# Patient Record
Sex: Male | Born: 1965 | Race: White | Hispanic: No | Marital: Married | Smoking: Never smoker
Health system: Southern US, Community
[De-identification: ages and names within clinical notes are randomized; demographics above are authoritative.]

## PROBLEM LIST (undated history)

## (undated) ENCOUNTER — Ambulatory Visit (HOSPITAL_COMMUNITY): Admission: EM | Payer: 59

## (undated) DIAGNOSIS — E119 Type 2 diabetes mellitus without complications: Secondary | ICD-10-CM

## (undated) DIAGNOSIS — F32A Depression, unspecified: Secondary | ICD-10-CM

## (undated) DIAGNOSIS — T7840XA Allergy, unspecified, initial encounter: Secondary | ICD-10-CM

## (undated) DIAGNOSIS — I10 Essential (primary) hypertension: Secondary | ICD-10-CM

## (undated) DIAGNOSIS — I219 Acute myocardial infarction, unspecified: Secondary | ICD-10-CM

## (undated) DIAGNOSIS — C801 Malignant (primary) neoplasm, unspecified: Secondary | ICD-10-CM

## (undated) DIAGNOSIS — Z860100 Personal history of colon polyps, unspecified: Secondary | ICD-10-CM

## (undated) DIAGNOSIS — R011 Cardiac murmur, unspecified: Secondary | ICD-10-CM

## (undated) DIAGNOSIS — N189 Chronic kidney disease, unspecified: Secondary | ICD-10-CM

## (undated) DIAGNOSIS — J302 Other seasonal allergic rhinitis: Secondary | ICD-10-CM

## (undated) DIAGNOSIS — K219 Gastro-esophageal reflux disease without esophagitis: Secondary | ICD-10-CM

## (undated) DIAGNOSIS — K921 Melena: Secondary | ICD-10-CM

## (undated) DIAGNOSIS — M1990 Unspecified osteoarthritis, unspecified site: Secondary | ICD-10-CM

## (undated) DIAGNOSIS — F329 Major depressive disorder, single episode, unspecified: Secondary | ICD-10-CM

## (undated) DIAGNOSIS — G4730 Sleep apnea, unspecified: Secondary | ICD-10-CM

## (undated) HISTORY — DX: Allergy, unspecified, initial encounter: T78.40XA

## (undated) HISTORY — DX: Melena: K92.1

## (undated) HISTORY — DX: Chronic kidney disease, unspecified: N18.9

## (undated) HISTORY — DX: Type 2 diabetes mellitus without complications: E11.9

## (undated) HISTORY — DX: Depression, unspecified: F32.A

## (undated) HISTORY — DX: Other seasonal allergic rhinitis: J30.2

## (undated) HISTORY — DX: Cardiac murmur, unspecified: R01.1

## (undated) HISTORY — DX: Sleep apnea, unspecified: G47.30

## (undated) HISTORY — DX: Acute myocardial infarction, unspecified: I21.9

## (undated) HISTORY — PX: SPINE SURGERY: SHX786

## (undated) HISTORY — PX: FRACTURE SURGERY: SHX138

## (undated) HISTORY — DX: Essential (primary) hypertension: I10

## (undated) HISTORY — DX: Personal history of colon polyps, unspecified: Z86.0100

## (undated) HISTORY — DX: Personal history of colonic polyps: Z86.010

## (undated) HISTORY — PX: BACK SURGERY: SHX140

## (undated) HISTORY — PX: WRIST SURGERY: SHX841

## (undated) HISTORY — PX: ORIF ANKLE FRACTURE: SUR919

## (undated) HISTORY — PX: OTHER SURGICAL HISTORY: SHX169

---
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
## 1987-09-24 HISTORY — PX: LAMINECTOMY: SHX219

## 1987-09-24 HISTORY — PX: BACK SURGERY: SHX140

## 1988-09-23 HISTORY — PX: LAMINECTOMY: SHX219

## 2006-09-23 DIAGNOSIS — C801 Malignant (primary) neoplasm, unspecified: Secondary | ICD-10-CM

## 2006-09-23 HISTORY — PX: NEPHRECTOMY: SHX65

## 2006-09-23 HISTORY — DX: Malignant (primary) neoplasm, unspecified: C80.1

## 2010-09-23 HISTORY — PX: LEG AMPUTATION THROUGH LOWER TIBIA AND FIBULA: SHX697

## 2013-09-23 HISTORY — PX: NOSE SURGERY: SHX723

## 2016-09-23 HISTORY — PX: COLONOSCOPY: SHX174

## 2017-09-04 ENCOUNTER — Encounter: Payer: Self-pay | Admitting: Family Medicine

## 2017-11-19 ENCOUNTER — Encounter: Payer: Self-pay | Admitting: Family Medicine

## 2018-04-20 ENCOUNTER — Encounter: Payer: Self-pay | Admitting: Family Medicine

## 2018-12-11 ENCOUNTER — Encounter: Payer: Self-pay | Admitting: Family Medicine

## 2019-05-14 ENCOUNTER — Ambulatory Visit: Payer: 59 | Admitting: Family Medicine

## 2019-05-14 ENCOUNTER — Other Ambulatory Visit: Payer: Self-pay

## 2019-05-14 ENCOUNTER — Encounter: Payer: Self-pay | Admitting: Family Medicine

## 2019-05-14 VITALS — BP 130/84 | HR 78 | Temp 98.4°F | Resp 12 | Wt 338.4 lb

## 2019-05-14 DIAGNOSIS — E1122 Type 2 diabetes mellitus with diabetic chronic kidney disease: Secondary | ICD-10-CM

## 2019-05-14 DIAGNOSIS — E1169 Type 2 diabetes mellitus with other specified complication: Secondary | ICD-10-CM | POA: Insufficient documentation

## 2019-05-14 DIAGNOSIS — G4733 Obstructive sleep apnea (adult) (pediatric): Secondary | ICD-10-CM

## 2019-05-14 DIAGNOSIS — I10 Essential (primary) hypertension: Secondary | ICD-10-CM

## 2019-05-14 DIAGNOSIS — Z85528 Personal history of other malignant neoplasm of kidney: Secondary | ICD-10-CM | POA: Diagnosis not present

## 2019-05-14 DIAGNOSIS — E785 Hyperlipidemia, unspecified: Secondary | ICD-10-CM | POA: Diagnosis not present

## 2019-05-14 DIAGNOSIS — I2510 Atherosclerotic heart disease of native coronary artery without angina pectoris: Secondary | ICD-10-CM | POA: Diagnosis not present

## 2019-05-14 DIAGNOSIS — E1129 Type 2 diabetes mellitus with other diabetic kidney complication: Secondary | ICD-10-CM | POA: Insufficient documentation

## 2019-05-14 DIAGNOSIS — G63 Polyneuropathy in diseases classified elsewhere: Secondary | ICD-10-CM

## 2019-05-14 DIAGNOSIS — Z9989 Dependence on other enabling machines and devices: Secondary | ICD-10-CM

## 2019-05-14 DIAGNOSIS — G629 Polyneuropathy, unspecified: Secondary | ICD-10-CM | POA: Insufficient documentation

## 2019-05-14 LAB — COMPREHENSIVE METABOLIC PANEL
ALT: 24 U/L (ref 0–53)
AST: 13 U/L (ref 0–37)
Albumin: 4.8 g/dL (ref 3.5–5.2)
Alkaline Phosphatase: 68 U/L (ref 39–117)
BUN: 19 mg/dL (ref 6–23)
CO2: 28 mEq/L (ref 19–32)
Calcium: 9.7 mg/dL (ref 8.4–10.5)
Chloride: 103 mEq/L (ref 96–112)
Creatinine, Ser: 1.11 mg/dL (ref 0.40–1.50)
GFR: 68.68 mL/min (ref >60.00)
Glucose, Bld: 92 mg/dL (ref 70–99)
Potassium: 4.3 mEq/L (ref 3.5–5.1)
Sodium: 140 mEq/L (ref 135–145)
Total Bilirubin: 0.9 mg/dL (ref 0.2–1.2)
Total Protein: 6.8 g/dL (ref 6.0–8.3)

## 2019-05-14 LAB — LIPID PANEL
Cholesterol: 118 mg/dL (ref 0–200)
HDL: 41 mg/dL (ref >39.00)
LDL Cholesterol: 48 mg/dL (ref 0–99)
NonHDL: 77.42
Total CHOL/HDL Ratio: 3
Triglycerides: 148 mg/dL (ref 0.0–149.0)
VLDL: 29.6 mg/dL (ref 0.0–40.0)

## 2019-05-14 MED ORDER — LISINOPRIL 20 MG PO TABS: 20.0000 mg | ORAL_TABLET | Freq: Every day | ORAL | 1 refills | Status: DC

## 2019-05-14 MED ORDER — AMLODIPINE BESYLATE 10 MG PO TABS: 10.0000 mg | ORAL_TABLET | Freq: Every day | ORAL | 2 refills | Status: DC

## 2019-05-14 NOTE — Patient Instructions (Signed)
A few things to remember from today's visit:   Hx of renal cell cancer - Plan: Ambulatory referral to Urology  Type 2 diabetes mellitus with chronic kidney disease, without long-term current use of insulin, unspecified CKD stage ([place]) - Plan: Comprehensive metabolic panel  Benign essential hypertension - Plan: Comprehensive metabolic panel, lisinopril (ZESTRIL) 20 MG tablet, amLODipine (NORVASC) 10 MG tablet  Hyperlipidemia associated with type 2 diabetes mellitus ([place]) - Plan: Comprehensive metabolic panel, Lipid panel  Coronary artery disease involving native heart without angina pectoris, unspecified vessel or lesion type - Plan: Ambulatory referral to Cardiology  OSA treated with BiPAP - Plan: Ambulatory referral to Pulmonology  No changes on any other medications today.  Please be sure medication list is accurate. If a new problem present, please set up appointment sooner than planned today.

## 2019-05-14 NOTE — Progress Notes (Signed)
HPI:   Wayne Larsen is a 53 y.o. male, who is here today to establish care.  Former PCP: He moved from [place] in 06/2018, has been back and fourth until recently. Last preventive routine visit: 2 years ago. He lives alone for now. His wife is still in [place], will stay there until she retires.  Chronic medical problems:   DM II with peripheral neuropathy.  Dx in 2018. He follows with endocrinologist. He is on 850 bid,Farxiga 10 mg daily,and Rybelsus 7 mg daily. He is on Lyrica 200 mg bid, which helps with pain and numbness sensation.  HTN: He is on Amlodipine 10 mg and Lisinopril 20 mg daily. He doe snot check BP regularly. Negative for headache,visual changes,orthopnea,or edema.  Reporting Hx of CAD. He could not complete stress test x 2, chemical stress test was "horrible." He has not noted chest pain,dyspnea,palpitations. HLD on Atorvastatin 40 mg. Last checked over 6 months ago.  OSA and insomnia: He wears CPAP every night. He is also on Ambien 10 mg daily as needed. Until 11/2018 he was traveling almost weekly,local and overseas.  Renal cancer s/p right nephrectomy, 2008. Has followed with urologist annually.   Concerns today: He needs referrals.  Review of Systems  Constitutional: Negative for activity change, appetite change, chills and fever.  HENT: Negative for mouth sores, nosebleeds and sore throat.   Respiratory: Negative for cough and wheezing.   Gastrointestinal: Negative for blood in stool.       No changes in bowel habits.  Endocrine: Negative for polydipsia, polyphagia and polyuria.  Genitourinary: Negative for decreased urine volume, dysuria and hematuria.  Musculoskeletal: Negative for gait problem.  Skin: Negative for rash and wound.  Neurological: Negative for syncope and facial asymmetry.  Rest see pertinent positives and negatives per HPI.   Current Outpatient Medications on File Prior to Visit  Medication Sig Dispense Refill   . ALREX 0.2 % SUSP PLEASE SEE ATTACHED FOR DETAILED DIRECTIONS    . atorvastatin (LIPITOR) 40 MG tablet TAKE 1 TAB BY MOUTH AT BEDTIME    . FARXIGA 10 MG TABS tablet Take 10 mg by mouth daily.    . metFORMIN (GLUCOPHAGE) 850 MG tablet Take 850 mg by mouth 2 (two) times daily.    Marland Kitchen PAZEO 0.7 % SOLN INSTILL 1 DROP INTO BOTH EYES DAILY DURING ALLERGY SEASON    . pregabalin (LYRICA) 200 MG capsule Take 200 mg by mouth 2 (two) times daily.    . RYBELSUS 7 MG TABS TAKE 1 TAB WITH 4OZ OF WATER ON EMPTY STOMACH 30MIN BEFORE BREAKFAST OR OTHER MEDS DAILY    . trimethoprim-polymyxin b (POLYTRIM) ophthalmic solution INSTILL 1 DROP INTO AFFECTED EYE 4 TIMES A DAY FOR 5 DAYS    . zolpidem (AMBIEN) 10 MG tablet Take 10 mg by mouth at bedtime.     No current facility-administered medications on file prior to visit.      Past Medical History:  Diagnosis Date  . Allergy    seasonal allergies  . Blood in stool    history of blood in stool  . Cancer [facility]) 2008   kidney  . Chronic kidney disease   . Depression   . Diabetes mellitus without complication ([place])   . Heart murmur   . History of colon polyps   . Hypertension    No Known Allergies  Family History  Problem Relation Age of Onset  . Cancer Mother   . Early death Mother   .  Arthritis Father   . Alcohol abuse Father   . Heart disease Father   . Hyperlipidemia Father   . Hypertension Father     Social History   Socioeconomic History  . Marital status: Married    Spouse name: Not on file  . Number of children: 2  . Years of education: Not on file  . Highest education level: Not on file  Occupational History  . Not on file  Social Needs  . Financial resource strain: Not on file  . Food insecurity    Worry: Not on file    Inability: Not on file  . Transportation needs    Medical: Not on file    Non-medical: Not on file  Tobacco Use  . Smoking status: Never Smoker  . Smokeless tobacco: Never Used  Substance and Sexual  Activity  . Alcohol use: Yes  . Drug use: Never  . Sexual activity: Yes  Lifestyle  . Physical activity    Days per week: Not on file    Minutes per session: Not on file  . Stress: Not on file  Relationships  . Social Herbalist on phone: Not on file    Gets together: Not on file    Attends religious service: Not on file    Active member of club or organization: Not on file    Attends meetings of clubs or organizations: Not on file    Relationship status: Not on file  Other Topics Concern  . Not on file  Social History Narrative  . Not on file    Vitals:   05/14/19 1226  BP: 130/84  Pulse: 78  Resp: 12  Temp: 98.4 F (36.9 C)  SpO2: 96%    There is no height or weight on file to calculate BMI.  Physical Exam  Nursing note and vitals reviewed. Constitutional: He is oriented to person, place, and time. He appears well-developed. No distress.  HENT:  Head: Normocephalic and atraumatic.  Mouth/Throat: Oropharynx is clear and moist and mucous membranes are normal.  Eyes: Pupils are equal, round, and reactive to light. Conjunctivae are normal.  Cardiovascular: Normal rate and regular rhythm.  No murmur heard. Pulses:      Dorsalis pedis pulses are 2+ on the right side and 2+ on the left side.  Respiratory: Effort normal and breath sounds normal. No respiratory distress.  GI: Soft. He exhibits no mass. There is no hepatomegaly. There is no abdominal tenderness.  Musculoskeletal:        General: No edema.  Lymphadenopathy:    He has no cervical adenopathy.  Neurological: He is alert and oriented to person, place, and time. He has normal strength. No cranial nerve deficit. Gait normal.  Skin: Skin is warm. No rash noted. No erythema.  Psychiatric: He has a normal mood and affect.  Well groomed, good eye contact.    ASSESSMENT AND PLAN:  Mr. Yahye was seen today for establish care.  Diagnoses and all orders for this visit:  Lab Results  Component Value  Date   CHOL 118 05/14/2019   HDL 41.00 05/14/2019   LDLCALC 48 05/14/2019   TRIG 148.0 05/14/2019   CHOLHDL 3 05/14/2019   Lab Results  Component Value Date   CREATININE 1.11 05/14/2019   BUN 19 05/14/2019   NA 140 05/14/2019   K 4.3 05/14/2019   CL 103 05/14/2019   CO2 28 05/14/2019   Lab Results  Component Value Date   ALT  24 05/14/2019   AST 13 05/14/2019   ALKPHOS 68 05/14/2019   BILITOT 0.9 05/14/2019    Benign essential hypertension Adequately controlled. No changes in current management. Low salt diet recommended. Monitor BP regularly. Eye exam at least annually. F/U in 6 months, before if needed.  -     Comprehensive metabolic panel -     lisinopril (ZESTRIL) 20 MG tablet; Take 1 tablet (20 mg total) by mouth daily. -     amLODipine (NORVASC) 10 MG tablet; Take 1 tablet (10 mg total) by mouth daily.  Type 2 diabetes mellitus with chronic kidney disease, without long-term current use of insulin, unspecified CKD stage ([place]) Tolerating meds well. Continue following with [facility]ocrinologist at [facility].  -     Comprehensive metabolic panel  Polyneuropathy associated with underlying disease ([place]) Lyrica 200 mg bid is helping. Stressed the importance of appropriate foot care. Following with endocrinologist.  Hyperlipidemia associated with type 2 diabetes mellitus ([place]) -     Comprehensive metabolic panel -     Lipid panel  Coronary artery disease involving native heart without angina pectoris, unspecified vessel or lesion type Asymptomatic but reporting not being able to complete stress test in the past due to symptomatology, ? Angina. Instructed about warning signs. He is not on Aspirin. Continue statin.  -     Ambulatory referral to Cardiology  Hx of renal cell cancer Continue following annually with urologists.  -     Ambulatory referral to Urology  OSA on CPAP Continue following with pulmonologist/sleep specialist. Encouraged to continue being  compliant with CPAP use.  -     Ambulatory referral to Pulmonology   Return in about 3 months (around 08/14/2019) for cpe.      G. Martinique, MD  [facility]. [place] office.

## 2019-05-15 MED ORDER — ATORVASTATIN CALCIUM 40 MG PO TABS: ORAL_TABLET | ORAL | 3 refills | Status: DC

## 2019-06-17 ENCOUNTER — Encounter: Payer: Self-pay | Admitting: Family Medicine

## 2019-06-24 ENCOUNTER — Institutional Professional Consult (permissible substitution): Payer: 59 | Admitting: Pulmonary Disease

## 2019-07-07 ENCOUNTER — Telehealth: Payer: Self-pay | Admitting: Family Medicine

## 2019-07-07 NOTE — Telephone Encounter (Signed)
Recv'd medical records from [facility] forwarded 60 pages to Dr. Ivory Broad 10/14/20fbg

## 2019-07-14 ENCOUNTER — Telehealth: Payer: Self-pay | Admitting: Family Medicine

## 2019-07-14 NOTE — Telephone Encounter (Signed)
Medication Refill - Medication: RYBELSUS 14 MG TABS   Has the patient contacted their pharmacy? No. (Agent: If no, request that the patient contact the pharmacy for the refill.) (Agent: If yes, when and what did the pharmacy advise?)  Preferred Pharmacy (with phone number or street name):  [id number] - [place], [address] [phone] (Phone) [phone] (Fax)     Agent: Please be advised that RX refills may take up to 3 business days. We ask that you follow-up with your pharmacy.

## 2019-07-14 NOTE — Telephone Encounter (Signed)
Recv'd medical records from [facility] forwarded 11 pages to Dr. Betty Martinique 10/21/20fbg

## 2019-07-19 ENCOUNTER — Other Ambulatory Visit: Payer: Self-pay

## 2019-07-19 NOTE — Telephone Encounter (Signed)
Please explained she is not on Rybelsus, this is a medication for diabetes that I have not prescribed.  Thanks, BJ

## 2019-07-19 NOTE — Telephone Encounter (Signed)
Ok for refill? 

## 2019-07-21 NOTE — Telephone Encounter (Signed)
Pt needs refill, please advise

## 2019-08-02 ENCOUNTER — Other Ambulatory Visit: Payer: Self-pay

## 2019-08-02 ENCOUNTER — Encounter: Payer: Self-pay | Admitting: Pulmonary Disease

## 2019-08-02 ENCOUNTER — Encounter: Payer: Self-pay | Admitting: Cardiovascular Disease

## 2019-08-02 ENCOUNTER — Ambulatory Visit: Payer: 59 | Admitting: Cardiovascular Disease

## 2019-08-02 VITALS — BP 140/84 | HR 71 | Ht 75.0 in | Wt 342.0 lb

## 2019-08-02 DIAGNOSIS — I10 Essential (primary) hypertension: Secondary | ICD-10-CM | POA: Diagnosis not present

## 2019-08-02 DIAGNOSIS — E785 Hyperlipidemia, unspecified: Secondary | ICD-10-CM | POA: Diagnosis not present

## 2019-08-02 DIAGNOSIS — I2510 Atherosclerotic heart disease of native coronary artery without angina pectoris: Secondary | ICD-10-CM

## 2019-08-02 DIAGNOSIS — E1169 Type 2 diabetes mellitus with other specified complication: Secondary | ICD-10-CM

## 2019-08-02 MED ORDER — POTASSIUM CHLORIDE ER 10 MEQ PO TBCR: 10.0000 meq | EXTENDED_RELEASE_TABLET | Freq: Every day | ORAL | 11 refills | Status: DC

## 2019-08-02 MED ORDER — ATORVASTATIN CALCIUM 40 MG PO TABS: ORAL_TABLET | ORAL | 3 refills | Status: DC

## 2019-08-02 MED ORDER — AMLODIPINE BESYLATE 5 MG PO TABS: 5.0000 mg | ORAL_TABLET | Freq: Every day | ORAL | 3 refills | Status: DC

## 2019-08-02 MED ORDER — HYDROCHLOROTHIAZIDE 25 MG PO TABS: 25.0000 mg | ORAL_TABLET | Freq: Every day | ORAL | 11 refills | Status: DC

## 2019-08-02 NOTE — Patient Instructions (Addendum)
Medication Instructions:  Your physician has recommended you make the following change in your medication:  START HCTZ (Hydrochlorothiazide) 25 mg once daily START Kdur (Potassium chloride) 10 mEq once daily DECREASE Amlodipine to 5 mg once daily  *If you need a refill on your cardiac medications before your next appointment, please call your pharmacy*   Lab Work: None Ordered   Testing/Procedures: None Ordered   Follow-Up: Your physician recommends that you send [place] follow-up blood pressure readings after your appointment with Dr. Martinique on Dec. 2   At [facility], you and your health needs are our priority.  As part of our continuing mission to provide you with exceptional heart care, we have created designated Provider Care Teams.  These Care Teams include your primary Cardiologist (physician) and Advanced Practice Providers (APPs -  Physician Assistants and Nurse Practitioners) who all work together to provide you with the care you need, when you need it.  Your next appointment:   3 months on February 10 at 4:20 pm with Dr. Acie Fredrickson  The format for your next appointment:   In Person  Provider:   You may see Dr. Acie Fredrickson or one of the following Advanced Practice Providers on your designated Care Team:    Richardson Dopp, PA-C  [place], [place]  Daune Perch, [place]

## 2019-08-02 NOTE — Progress Notes (Signed)
Cardiology Office Note:    Date:  08/02/2019   ID:  Wayne Larsen, DOB 1965-10-10, MRN [id number]  PCP:  Martinique, Betty G, MD  Cardiologist:  No primary care provider on file.  Electrophysiologist:  None   Referring MD: Martinique, Betty G, MD   Chief Complaint  Patient presents with  . Hypertension  . Hyperlipidemia  . Diabetes Mellitus     History of Present Illness:    Wayne Larsen is a 53 y.o. male with a hx of hypertension, hyperlipidemia.  He recently moved from [place] and is here to establish care.  We were asked to see him for his HTN and hyperlipidemia by Dr. [name] .   Hx of renal cell cancer - s/p right nephrectomy    Originally from [place] .  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  Has been told that had a MI years ago.  Has not had cath or stenting .   Fairly active now.   Luz [place] quite a bit .  [place] .   Trying to start an exercise program .  Works 16 hour days .   No CP , works in yard without any issues.  Eats quite a bit out on the road.  And when he is at home he cooks foods that are typically preprepared.     Past Medical History:  Diagnosis Date  . Allergy    seasonal allergies  . Blood in stool    history of blood in stool  . Cancer [facility]) 2008   kidney  . Chronic kidney disease   . Depression   . Diabetes mellitus without complication ([place])   . Heart murmur   . History of colon polyps   . Hypertension     Past Surgical History:  Procedure Laterality Date  . [place]  . 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 NOSE SURGERY  2015    Current Medications: Current Meds  Medication Sig  . ALREX 0.2 % SUSP PLEASE SEE ATTACHED FOR DETAILED DIRECTIONS  . FARXIGA 10 MG TABS tablet Take 10 mg by mouth  daily.  Marland Kitchen lisinopril (ZESTRIL) 20 MG tablet Take 1 tablet (20 mg total) by mouth daily.  . metFORMIN (GLUCOPHAGE) 850 MG tablet Take 850 mg by mouth 2 (two) times daily.  Marland Kitchen PAZEO 0.7 % SOLN INSTILL 1 DROP INTO BOTH EYES DAILY DURING ALLERGY SEASON  . pregabalin (LYRICA) 200 MG capsule Take 200 mg by mouth 2 (two) times daily.  . Semaglutide (RYBELSUS) 14 MG TABS Take 1 tablet by mouth daily.  Marland Kitchen trimethoprim-polymyxin b (POLYTRIM) ophthalmic solution INSTILL 1 DROP INTO AFFECTED EYE 4 TIMES A DAY FOR 5 DAYS  . zolpidem (AMBIEN) 10 MG tablet Take 10 mg by mouth at bedtime.  . [DISCONTINUED] amLODipine (NORVASC) 10 MG tablet Take 1 tablet (10 mg total) by mouth daily.  . [DISCONTINUED] atorvastatin (LIPITOR) 40 MG tablet TAKE 1 TAB BY MOUTH AT BEDTIME     Allergies:   Patient has no known allergies.   Social History   Socioeconomic History  . Marital status: Married    Spouse name: Not on file  . Number of children: 2  . Years of education: Not on file  .  Highest education level: Not on file  Occupational History  . Not on file  Social Needs  . Financial resource strain: Not on file  . Food insecurity    Worry: Not on file    Inability: Not on file  . Transportation needs    Medical: Not on file    Non-medical: Not on file  Tobacco Use  . Smoking status: Never Smoker  . Smokeless tobacco: Never Used  Substance and Sexual Activity  . Alcohol use: Yes  . Drug use: Never  . Sexual activity: Yes  Lifestyle  . Physical activity    Days per week: Not on file    Minutes per session: Not on file  . Stress: Not on file  Relationships  . Social Herbalist on phone: Not on file    Gets together: Not on file    Attends religious service: Not on file    Active member of club or organization: Not on file    Attends meetings of clubs or organizations: Not on file    Relationship status: Not on file  Other Topics Concern  . Not on file  Social History Narrative  . Not on  file     Family History: The patient's family history includes Alcohol abuse in his father; Arthritis in his father; Cancer in his mother; Early death in his mother; Heart disease in his father; Hyperlipidemia in his father; Hypertension in his father.  ROS:   Please see the history of present illness.     All other systems reviewed and are negative.  EKGs/Labs/Other Studies Reviewed:    The following studies were reviewed today:   EKG:  Nov. 9, 2020 :  NSR at 7,   Inf. MI ,    Recent Labs: 05/14/2019: ALT 24; BUN 19; Creatinine, Ser 1.11; Potassium 4.3; Sodium 140  Recent Lipid Panel    Component Value Date/Time   CHOL 118 05/14/2019 1312   TRIG 148.0 05/14/2019 1312   HDL 41.00 05/14/2019 1312   CHOLHDL 3 05/14/2019 1312   VLDL 29.6 05/14/2019 1312   LDLCALC 48 05/14/2019 1312    Physical Exam:    VS:  BP 140/84   Pulse 71   Ht 6\' 3"  (1.905 m)   Wt (!) 342 lb (155.1 kg)   SpO2 97%   BMI 42.75 kg/m     Wt Readings from Last 3 Encounters:  08/02/19 (!) 342 lb (155.1 kg)  05/14/19 (!) 338 lb 6 oz (153.5 kg)     GEN: middle age male,  Morbid obesty .. no acute  distress HEENT: Normal NECK: No JVD; No carotid bruits LYMPHATICS: No lymphadenopathy CARDIAC: RRR, no murmurs, rubs, gallops RESPIRATORY:  Clear to auscultation without rales, wheezing or rhonchi  ABDOMEN: Soft, non-tender, non-distended MUSCULOSKELETAL:  No edema; No deformity  SKIN: Warm and dry NEUROLOGIC:  Alert and oriented x 3 PSYCHIATRIC:  Normal affect   ASSESSMENT:    1. Essential hypertension   2. Hyperlipidemia, unspecified hyperlipidemia type    PLAN:    In order of problems listed above:  1. Essential hypertension: [name] presents today for further evaluation of hypertension hyperlipidemia.  He has mildly elevated blood pressure today.  He complains of some leg swelling.  He is on amlodipine 10 mg a day.  We will reduce the amlodipine to 5 mg a day.  We will add HCTZ 25 mg a day and  K-Dur 10  milliequivalents a day.  We stressed the  need for better diet, exercise, weight loss program.  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  He really does not have much time off.  He knows that he needs to hire more people in order to get healthy but at present that is a difficult thing to do.  2.  Hyperlipidemia: Continue atorvastatin.  Will refill this. Check labs in 3 months     Medication Adjustments/Labs and Tests Ordered: Current medicines are reviewed at length with the patient today.  Concerns regarding medicines are outlined above.  Orders Placed This Encounter  Procedures  . Basic Metabolic Panel (BMET)  . Lipid Profile  . Basic Metabolic Panel (BMET)  . Hepatic function panel  . EKG 12-Lead   Meds ordered this encounter  Medications  . amLODipine (NORVASC) 5 MG tablet    Sig: Take 1 tablet (5 mg total) by mouth daily.    Dispense:  90 tablet    Refill:  3  . potassium chloride (KLOR-CON) 10 MEQ tablet    Sig: Take 1 tablet (10 mEq total) by mouth daily.    Dispense:  30 tablet    Refill:  11  . hydrochlorothiazide (HYDRODIURIL) 25 MG tablet    Sig: Take 1 tablet (25 mg total) by mouth daily.    Dispense:  30 tablet    Refill:  11    Patient Instructions  Medication Instructions:  Your physician has recommended you make the following change in your medication:  START HCTZ (Hydrochlorothiazide) 25 mg once daily START Kdur (Potassium chloride) 10 mEq once daily DECREASE Amlodipine to 5 mg once daily  *If you need a refill on your cardiac medications before your next appointment, please call your pharmacy*   Lab Work: None Ordered   Testing/Procedures: None Ordered   Follow-Up: Your physician recommends that you send [place] follow-up blood pressure readings after your appointment with Dr. Martinique on Dec. 2   At [facility], you and your health needs are our priority.  As part of our  continuing mission to provide you with exceptional heart care, we have created designated Provider Care Teams.  These Care Teams include your primary Cardiologist (physician) and Advanced Practice Providers (APPs -  Physician Assistants and Nurse Practitioners) who all work together to provide you with the care you need, when you need it.  Your next appointment:   3 months on February 10 at 4:20 pm with Dr. Acie Fredrickson  The format for your next appointment:   In Person  Provider:   You may see Dr. Acie Fredrickson or one of the following Advanced Practice Providers on your designated Care Team:    Richardson Dopp, PA-C  [place], [place]  Daune Perch, [place]       Signed, Mertie Moores, MD  08/02/2019 6:13 PM    [place]

## 2019-08-03 ENCOUNTER — Encounter: Payer: Self-pay | Admitting: Pulmonary Disease

## 2019-08-03 ENCOUNTER — Ambulatory Visit (INDEPENDENT_AMBULATORY_CARE_PROVIDER_SITE_OTHER): Payer: 59 | Admitting: Pulmonary Disease

## 2019-08-03 DIAGNOSIS — G4733 Obstructive sleep apnea (adult) (pediatric): Secondary | ICD-10-CM | POA: Diagnosis not present

## 2019-08-03 DIAGNOSIS — F5104 Psychophysiologic insomnia: Secondary | ICD-10-CM | POA: Diagnosis not present

## 2019-08-03 DIAGNOSIS — G4700 Insomnia, unspecified: Secondary | ICD-10-CM | POA: Insufficient documentation

## 2019-08-03 MED ORDER — ZOLPIDEM TARTRATE 10 MG PO TABS: 10.0000 mg | ORAL_TABLET | Freq: Every evening | ORAL | 2 refills | Status: DC | PRN

## 2019-08-03 NOTE — Telephone Encounter (Signed)
Called LVM  [facility] REGARDING PT medical record, advised to call office for further information    Copied from [place] #[id number]. Topic: General - Other >> Aug 02, 2019 10:06 AM Yvette Rack wrote: Reason for CRM: Guinevere Ferrari with [facility] stated she spoke with Medical records dept and was told pt records are not available. Rose would like to know if Dr. Martinique has pt medical records. Rose stated it about 24 pages that has not been uploaded. Cb# [phone]

## 2019-08-03 NOTE — Patient Instructions (Signed)
Please call us back with the name of DME company. You are on auto BiPAP settings We will send them in order to discontinue ramp time.  Refill on Ambien will be sent

## 2019-08-03 NOTE — Progress Notes (Signed)
Subjective:    Patient ID: Wayne Larsen, male    DOB: Jun 24, 1966, 53 y.o.   MRN: [id number]  HPI  53 year old presents to establish care for obstructive sleep apnea. He works as the Market researcher and is originally from [place]. He has just moved from [place] to [place].  He was diagnosed with severe OSA and insomnia and has been on PAP since 2008.  He underwent repeat testing in [place] and was placed on auto BiPAP around 2016.  He reports improvement in his daytime somnolence and fatigue and has had good results, has settled with a full facemask. He wishes that pressure could be higher he does not like the ramp time of 15 minutes at 4 cm and the low pressure.  Epworth sleepiness score is 11. Bedtime is around 10:30 PM, he takes Ambien about once a week and when he takes his falls asleep fairly quickly.  He sleeps on his side with 1 pillow, reports 2-3 nocturnal awakenings and is out of bed by 6 AM feeling rested without dryness of mouth or headaches There is no history suggestive of cataplexy, sleep paralysis or parasomnias   He reports a high pressure job involving working with different time zones and therefore his sleep habits are somewhat skewed.  Hypertension is well controlled on 3 medications, diabetes is currently controlled.  He has been unable to lose weight in the past few years  BiPAP download was reviewed which shows average pressure of 16/12 cm, ramp time: 15 minutes, maximum Pressure of 20/16 centimeters with good compliance and minimal leak, residual AHI 5.8/hour  Past Medical History:  Diagnosis Date  . Allergy    seasonal allergies  . Blood in stool    history of blood in stool  . Cancer [facility]) 2008   kidney  . Chronic kidney disease   . Depression   . Diabetes mellitus without complication ([place])   . Heart murmur   . History of colon polyps   . Hypertension    Past Surgical History:  Procedure Laterality Date  . [place]  . 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 NOSE SURGERY  2015    No Known Allergies  Social History   Socioeconomic History  . Marital status: Married    Spouse name: Not on file  . Number of children: 2  . Years of education: Not on file  . Highest education level: Not on file  Occupational History  . Not on file  Social Needs  . Financial resource strain: Not on file  . Food insecurity    Worry: Not on file    Inability: Not on file  . Transportation needs    Medical: Not on file    Non-medical: Not on file  Tobacco Use  . Smoking status: Never Smoker  . Smokeless tobacco: Never Used  Substance and Sexual Activity  . Alcohol use: Yes  . Drug use: Never  . Sexual activity: Yes  Lifestyle  . Physical activity    Days per week: Not on file    Minutes per session: Not on file  . Stress: Not on file  Relationships  . Social Herbalist on phone: Not on file    Gets together: Not on file    Attends religious service: Not on file    Active member of club or organization: Not on file    Attends meetings of  clubs or organizations: Not on file    Relationship status: Not on file  . Intimate partner violence    Fear of current or ex partner: Not on file    Emotionally abused: Not on file    Physically abused: Not on file    Forced sexual activity: Not on file  Other Topics Concern  . Not on file  Social History Narrative  . Not on file    Family History  Problem Relation Age of Onset  . Cancer Mother   . Early death Mother   . Arthritis Father   . Alcohol abuse Father   . Heart disease Father   . Hyperlipidemia Father   . Hypertension Father     Review of Systems Constitutional: negative for anorexia, fevers and sweats  Eyes: negative for irritation, redness and visual disturbance  Ears, nose, mouth, throat, and face: negative for earaches, epistaxis, nasal congestion and sore throat  Respiratory:  negative for cough, dyspnea on exertion, sputum and wheezing  Cardiovascular: negative for chest pain, dyspnea, lower extremity edema, orthopnea, palpitations and syncope  Gastrointestinal: negative for abdominal pain, constipation, diarrhea, melena, nausea and vomiting  Genitourinary:negative for dysuria, frequency and hematuria  Hematologic/lymphatic: negative for bleeding, easy bruising and lymphadenopathy  Musculoskeletal:negative for arthralgias, muscle weakness and stiff joints  Neurological: negative for coordination problems, gait problems, headaches and weakness  Endocrine: negative for diabetic symptoms including polydipsia, polyuria and weight loss     Objective:   Physical Exam  Gen. Pleasant, obese, in no distress, normal affect ENT - no pallor,icterus, no post nasal drip, class 2-3 airway Neck: No JVD, no thyromegaly, no carotid bruits Lungs: no use of accessory muscles, no dullness to percussion, decreased without rales or rhonchi  Cardiovascular: Rhythm regular, heart sounds  normal, no murmurs or gallops, no peripheral edema Abdomen: soft and non-tender, no hepatosplenomegaly, BS normal. Musculoskeletal: No deformities, no cyanosis or clubbing Neuro:  alert, non focal, no tremors       Assessment & Plan:

## 2019-08-03 NOTE — Assessment & Plan Note (Signed)
Refill on Ambien will be sent Discussed lifestyle changes and possible side effects of excessive sedation

## 2019-08-03 NOTE — Assessment & Plan Note (Addendum)
Please call us back with the name of DME company. You are on auto BiPAP settings We will send them in order to discontinue ramp time.  We will also try to obtain his sleep studies from his physician in [place] He is very compliant with BiPAP is helping to improve his daytime somnolence and fatigue  Weight loss encouraged, compliance with goal of at least 4-6 hrs every night is the expectation. Advised against medications with sedative side effects Cautioned against driving when sleepy - understanding that sleepiness will vary on a day to day basis

## 2019-08-24 ENCOUNTER — Other Ambulatory Visit: Payer: Self-pay

## 2019-08-25 ENCOUNTER — Ambulatory Visit (INDEPENDENT_AMBULATORY_CARE_PROVIDER_SITE_OTHER): Payer: 59 | Admitting: Family Medicine

## 2019-08-25 ENCOUNTER — Encounter: Payer: Self-pay | Admitting: Family Medicine

## 2019-08-25 VITALS — BP 120/80 | HR 75 | Temp 98.2°F | Ht 75.0 in | Wt 338.8 lb

## 2019-08-25 DIAGNOSIS — E1122 Type 2 diabetes mellitus with diabetic chronic kidney disease: Secondary | ICD-10-CM | POA: Diagnosis not present

## 2019-08-25 DIAGNOSIS — Z0000 Encounter for general adult medical examination without abnormal findings: Secondary | ICD-10-CM | POA: Diagnosis not present

## 2019-08-25 DIAGNOSIS — I10 Essential (primary) hypertension: Secondary | ICD-10-CM | POA: Diagnosis not present

## 2019-08-25 DIAGNOSIS — Z23 Encounter for immunization: Secondary | ICD-10-CM | POA: Diagnosis not present

## 2019-08-25 LAB — BASIC METABOLIC PANEL
BUN: 23 mg/dL (ref 6–23)
CO2: 31 mEq/L (ref 19–32)
Calcium: 9.6 mg/dL (ref 8.4–10.5)
Chloride: 99 mEq/L (ref 96–112)
Creatinine, Ser: 1.27 mg/dL (ref 0.40–1.50)
GFR: 58.74 mL/min — ABNORMAL LOW (ref >60.00)
Glucose, Bld: 120 mg/dL — ABNORMAL HIGH (ref 70–99)
Potassium: 4.6 mEq/L (ref 3.5–5.1)
Sodium: 138 mEq/L (ref 135–145)

## 2019-08-25 NOTE — Progress Notes (Signed)
HPI:  [name] is a 53 y.o.male here today for his routine physical examination.  Last CPE: 07/03/2018. He lives alone for now, his wife with move here after she retires in 2021.  Regular exercise 3 or more times per week: No, he is planning on starting walking. Following a healthy diet: Not consistently, high sodium diet,planning on try fresh prepare meals service.  Chronic medical problems: DM2 (A1c on 06/03/2019 was 6.4), hyperlipidemia, hypertension, obesity, insomnia, OSA on BiPAP, depression, peripheral neuropathy, and CAD among some.  There is no immunization history on file for this patient.  -Hep C screening: 04/2017 NR  Last colon cancer screening: Colonoscopy in 08/2017, polypectomy x14.  He is due in 08/2022. Last prostate ca screening: 07/09/19, 0.32. He follows with urologist annually.  -Denies high alcohol intake, tobacco use, or Hx of illicit drug use.  -Concerns and/or follow up today: Renal function check.  His cardiologist adjusted his antihypertensive medications, added HCTZ 25 mg and decrease amlodipine to 5 mg. When he first started medication he developed nausea, "not feeling myself", it is progressively getting better. He is also on potassium supplementation 10 mEq daily.  DM2: He follows with endocrinologist.  Metformin dose was decreased from 850 mg twice daily to 850 mg daily.  Rest of the medication unchanged. BS's are "good."  Review of Systems  Constitutional: Negative for activity change, appetite change, fatigue and fever.  HENT: Negative for dental problem, nosebleeds, sore throat and trouble swallowing.   Eyes: Negative for redness and visual disturbance.  Respiratory: Negative for cough, shortness of breath and wheezing.   Cardiovascular: Negative for chest pain, palpitations and leg swelling.  Gastrointestinal: Negative for abdominal pain, blood in stool, and vomiting.  Endocrine: Negative for cold intolerance, heat intolerance,  polydipsia, polyphagia and polyuria.  Genitourinary: Negative for decreased urine volume, discharge, dysuria, genital sores, hematuria and testicular pain.  Musculoskeletal: Negative for gait problem and myalgias.  Skin: Negative for color change and rash.  Allergic/Immunologic: Positive for environmental allergies.  Neurological: Negative for syncope, weakness and headaches.  Hematological: Negative for adenopathy. Does not bruise/bleed easily.  Psychiatric/Behavioral: Negative for confusion. The patient is not nervous/anxious.   All other systems reviewed and are negative.  Current Outpatient Medications on File Prior to Visit  Medication Sig Dispense Refill  . ALREX 0.2 % SUSP PLEASE SEE ATTACHED FOR DETAILED DIRECTIONS    . amLODipine (NORVASC) 5 MG tablet Take 1 tablet (5 mg total) by mouth daily. 90 tablet 3  . atorvastatin (LIPITOR) 40 MG tablet TAKE 1 TAB BY MOUTH AT BEDTIME 90 tablet 3  . FARXIGA 10 MG TABS tablet Take 10 mg by mouth daily.    . hydrochlorothiazide (HYDRODIURIL) 25 MG tablet Take 1 tablet (25 mg total) by mouth daily. 30 tablet 11  . lisinopril (ZESTRIL) 20 MG tablet Take 1 tablet (20 mg total) by mouth daily. 90 tablet 1  . metFORMIN (GLUCOPHAGE) 850 MG tablet Take 850 mg by mouth daily.    Marland Kitchen PAZEO 0.7 % SOLN INSTILL 1 DROP INTO BOTH EYES DAILY DURING ALLERGY SEASON    . potassium chloride (KLOR-CON) 10 MEQ tablet Take 1 tablet (10 mEq total) by mouth daily. 30 tablet 11  . pregabalin (LYRICA) 200 MG capsule Take 200 mg by mouth 2 (two) times daily.    . Semaglutide (RYBELSUS) 14 MG TABS Take 1 tablet by mouth daily.    Marland Kitchen trimethoprim-polymyxin b (POLYTRIM) ophthalmic solution INSTILL 1 DROP INTO AFFECTED EYE 4 TIMES A  DAY FOR 5 DAYS    . zolpidem (AMBIEN) 10 MG tablet Take 1 tablet (10 mg total) by mouth at bedtime as needed for sleep. 30 tablet 2   No current facility-administered medications on file prior to visit.    Past Medical History:  Diagnosis Date   . Allergy    seasonal allergies  . Blood in stool    history of blood in stool  . Cancer [facility]) 2008   kidney  . Chronic kidney disease   . Depression   . Diabetes mellitus without complication ([place])   . Heart murmur   . History of colon polyps   . Hypertension    Past Surgical History:  Procedure Laterality Date  . [place]  . 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 NOSE SURGERY  2015   No Known Allergies  Family History  Problem Relation Age of Onset  . Cancer Mother   . Early death Mother   . Arthritis Father   . Alcohol abuse Father   . Heart disease Father   . Hyperlipidemia Father   . Hypertension Father    Social History   Socioeconomic History  . Marital status: Married    Spouse name: Not on file  . Number of children: 2  . Years of education: Not on file  . Highest education level: Not on file  Occupational History  . Not on file  Social Needs  . Financial resource strain: Not on file  . Food insecurity    Worry: Not on file    Inability: Not on file  . Transportation needs    Medical: Not on file    Non-medical: Not on file  Tobacco Use  . Smoking status: Never Smoker  . Smokeless tobacco: Never Used  Substance and Sexual Activity  . Alcohol use: Yes  . Drug use: Never  . Sexual activity: Yes  Lifestyle  . Physical activity    Days per week: Not on file    Minutes per session: Not on file  . Stress: Not on file  Relationships  . Social Herbalist on phone: Not on file    Gets together: Not on file    Attends religious service: Not on file    Active member of club or organization: Not on file    Attends meetings of clubs or organizations: Not on file    Relationship status: Not on file  Other Topics Concern  . Not on file  Social History Narrative  . Not on file    Today's Vitals   08/25/19 0753  BP: 120/80  Pulse: 75  Temp: 98.2 F (36.8 C)  TempSrc: Temporal  SpO2: 96%   Weight: (!) 338 lb 12.8 oz (153.7 kg)  Height: 6\' 3"  (1.905 m)   Body mass index is 42.35 kg/m.  Wt Readings from Last 3 Encounters:  08/25/19 (!) 338 lb 12.8 oz (153.7 kg)  08/03/19 (!) 339 lb 6.4 oz (154 kg)  08/02/19 (!) 342 lb (155.1 kg)    Physical Exam  Nursing note and vitals reviewed. Constitutional: He is oriented to person, place, and time. He appears well-developed. No distress.  HENT:  Head: Normocephalic and atraumatic.  Right Ear: Tympanic membrane, external ear and ear canal normal.  Left Ear: Tympanic membrane, external ear and ear canal normal.  Mouth/Throat: Oropharynx is clear and moist and mucous membranes are normal.  Eyes: Pupils are equal, round,  and reactive to light. Conjunctivae and EOM are normal.  Neck: Normal range of motion. No tracheal deviation present. No thyromegaly present.  Cardiovascular: Normal rate and regular rhythm.  No murmur heard. Pulses:      Dorsalis pedis pulses are 2+ on the right side and 2+ on the left side.  Respiratory: Effort normal and breath sounds normal. No respiratory distress.  GI: Soft. He exhibits no mass. There is no hepatomegaly. There is no abdominal tenderness.  Genitourinary:    Genitourinary Comments: No concerns. Deferred to urologist.  Musculoskeletal:        General: No tenderness or edema.     Comments: No major deformities appreciated and no signs of synovitis.  Lymphadenopathy:    He has no cervical adenopathy.       Right: No supraclavicular adenopathy present.       Left: No supraclavicular adenopathy present.  Neurological: He is alert and oriented to person, place, and time. He has normal strength. No cranial nerve deficit or sensory deficit. Coordination and gait normal.  Reflex Scores:      Bicep reflexes are 2+ on the right side and 2+ on the left side.      Patellar reflexes are 2+ on the right side and 2+ on the left side. Skin: Skin is warm. No erythema.  Psychiatric: He has a normal mood  and affect. Cognition and memory are normal.  Well groomed,good eye contact.   ASSESSMENT AND PLAN:  [name] was here today annual physical examination.  Orders Placed This Encounter  Procedures  . Basic metabolic panel   Lab Results  Component Value Date   CREATININE 1.27 08/25/2019   BUN 23 08/25/2019   NA 138 08/25/2019   K 4.6 08/25/2019   CL 99 08/25/2019   CO2 31 08/25/2019    Routine general medical examination at a health care facility We discussed the importance of regular physical activity and healthy diet for prevention of chronic illness and/or complications. Preventive guidelines reviewed. Vaccination up-to-date.  Influenza vaccine given today.  Next CPE in a year.  Type 2 diabetes mellitus with chronic kidney disease, without long-term current use of insulin, unspecified CKD stage ([place]) Problem is adequately controlled. Continue following with endocrinologist.  Benign essential hypertension BP adequately controlled. We discussed some side effects of medications. Recommend low salt diet. No changes in current management. Instructed to monitor BP at home.  Because he follows with cardiologist, urologist, and endocrinologist throughout the year, I think it is appropriate to continue annual visits, before if needed.  Return in 1 year (on 08/24/2020) for cpe.     G. [place], MD  [facility]. [place] office.

## 2019-08-25 NOTE — Patient Instructions (Addendum)
A few things to remember from today's visit:   Type 2 diabetes mellitus with chronic kidney disease, without long-term current use of insulin, unspecified CKD stage ([place]) - Plan: Basic metabolic panel  Benign essential hypertension  Routine general medical examination at a health care facility   At least 150 minutes of moderate exercise per week, daily brisk walking for 15-30 min is a good exercise option. Healthy diet low in saturated (animal) fats and sweets and consisting of fresh fruits and vegetables, lean meats such as fish and white chicken and whole grains.  - Vaccines:  Tdap vaccine every 10 years.  Shingles vaccine recommended at age 56, could be given after 55 years of age but not sure about insurance coverage.  Pneumonia vaccines:  Pneumovax at 80.   -Screening recommendations for low/normal risk males:  Colon cancer screening at age 78 and until age 11.  Prostate cancer screening: some controversy, starts usually at 11: Rectal exam and PSA.  Aortic Abdominal Aneurism once between 69 and 41 years old if ever smoker.  Also recommended:  1. Dental visit- Brush and floss your teeth twice daily; visit your dentist twice a year. 2. Eye doctor- Get an eye exam at least every 2 years. 3. Helmet use- Always wear a helmet when riding a bicycle, motorcycle, rollerblading or skateboarding. 4. Safe sex- If you may be exposed to sexually transmitted infections, use a condom. 5. Seat belts- Seat belts can save your live; always wear one. 6. Smoke/Carbon Monoxide detectors- These detectors need to be installed on the appropriate level of your home. Replace batteries at least once a year. 7. Skin cancer- When out in the sun please cover up and use sunscreen 15 SPF or higher. 8. Violence- If anyone is threatening or hurting you, please tell your healthcare provider.  9. Drink alcohol in moderation- Limit alcohol intake to one drink or less per day. Never drink and drive.  Please  be sure medication list is accurate. If a new problem present, please set up appointment sooner than planned today.

## 2019-08-30 ENCOUNTER — Encounter: Payer: Self-pay | Admitting: *Deleted

## 2019-11-03 ENCOUNTER — Ambulatory Visit (INDEPENDENT_AMBULATORY_CARE_PROVIDER_SITE_OTHER): Payer: 59 | Admitting: Cardiovascular Disease

## 2019-11-03 ENCOUNTER — Other Ambulatory Visit: Payer: Self-pay

## 2019-11-03 ENCOUNTER — Encounter: Payer: Self-pay | Admitting: Cardiovascular Disease

## 2019-11-03 VITALS — BP 114/70 | HR 67 | Ht 75.0 in | Wt 335.0 lb

## 2019-11-03 DIAGNOSIS — R600 Localized edema: Secondary | ICD-10-CM | POA: Diagnosis not present

## 2019-11-03 DIAGNOSIS — I10 Essential (primary) hypertension: Secondary | ICD-10-CM

## 2019-11-03 NOTE — Progress Notes (Signed)
Cardiology Office Note:    Date:  11/03/2019   ID:  Wayne Larsen, DOB [birth year], MRN [id number]  PCP:  Martinique, Betty G, MD  Cardiologist:  No primary care provider on file.  Electrophysiologist:  None   Referring MD: Martinique, Betty G, MD   Chief Complaint  Patient presents with  . Hypertension     History of Present Illness:    Wayne Larsen is a 54 y.o. male with a hx of hypertension, hyperlipidemia.  He recently moved from [place] and is here to establish care.  We were asked to see him for his HTN and hyperlipidemia by Dr. [name] .   Hx of renal cell cancer - s/p right nephrectomy    Originally from [place] .  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  Has been told that had a MI years ago.  Has not had cath or stenting .   Fairly active now.   Luz [place] quite a bit .  [place] .   Trying to start an exercise program .  Works 16 hour days .   No CP , works in yard without any issues.  Eats quite a bit out on the road.  And when he is at home he cooks foods that are typically preprepared.  November 03, 2019:  BP is much better ,  Edema is much better after reducing the amlodipine.  Is on  We added HCTZ and kdur  BP is much better  Wt is 335 lbs.  ( down 7 lbs from previous visit in Nov. 2021)   He is feeling much better.  He admits that he still eats too much.  Past Medical History:  Diagnosis Date  . Allergy    seasonal allergies  . Blood in stool    history of blood in stool  . Cancer [facility]) 2008   kidney  . Chronic kidney disease   . Depression   . Diabetes mellitus without complication ([place])   . Heart murmur   . History of colon polyps   . Hypertension     Past Surgical History:  Procedure Laterality Date  . [place]  . LEG  AMPUTATION THROUGH LOWER TIBIA AND FIBULA Left 2012  . NOSE SURGERY  2015    Current Medications: Current Meds  Medication Sig  . ALREX 0.2 % SUSP PLEASE SEE ATTACHED FOR DETAILED DIRECTIONS  . amLODipine (NORVASC) 5 MG tablet Take 1 tablet (5 mg total) by mouth daily.  Marland Kitchen atorvastatin (LIPITOR) 40 MG tablet TAKE 1 TAB BY MOUTH AT BEDTIME  . FARXIGA 10 MG TABS tablet Take 10 mg by mouth daily.  . hydrochlorothiazide (HYDRODIURIL) 25 MG tablet Take 1 tablet (25 mg total) by mouth daily.  Marland Kitchen lisinopril (ZESTRIL) 20 MG tablet Take 1 tablet (20 mg total) by mouth daily.  . metFORMIN (GLUCOPHAGE) 850 MG tablet Take 850 mg by mouth daily.  Marland Kitchen PAZEO 0.7 % SOLN INSTILL 1 DROP INTO BOTH EYES DAILY DURING ALLERGY SEASON  . potassium chloride (KLOR-CON) 10 MEQ tablet Take 1 tablet (10 mEq total) by mouth daily.  . pregabalin (LYRICA) 200 MG capsule Take 200 mg by mouth 2 (two) times daily.  . Semaglutide (RYBELSUS) 14 MG TABS Take 1 tablet by mouth  daily.  . trimethoprim-polymyxin b (POLYTRIM) ophthalmic solution INSTILL 1 DROP INTO AFFECTED EYE 4 TIMES A DAY FOR 5 DAYS  . zolpidem (AMBIEN) 10 MG tablet Take 1 tablet (10 mg total) by mouth at bedtime as needed for sleep.     Allergies:   Patient has no known allergies.   Social History   Socioeconomic History  . Marital status: Married    Spouse name: Not on file  . Number of children: 2  . Years of education: Not on file  . Highest education level: Not on file  Occupational History  . Not on file  Tobacco Use  . Smoking status: Never Smoker  . Smokeless tobacco: Never Used  Substance and Sexual Activity  . Alcohol use: Yes  . Drug use: Never  . Sexual activity: Yes  Other Topics Concern  . Not on file  Social History Narrative  . Not on file   Social Determinants of Health   Financial Resource Strain:   . Difficulty of Paying Living Expenses: Not on file  Food Insecurity:   . Worried About Charity fundraiser in the Last Year:  Not on file  . Ran Out of Food in the Last Year: Not on file  Transportation Needs:   . Lack of Transportation (Medical): Not on file  . Lack of Transportation (Non-Medical): Not on file  Physical Activity:   . Days of Exercise per Week: Not on file  . Minutes of Exercise per Session: Not on file  Stress:   . Feeling of Stress : Not on file  Social Connections:   . Frequency of Communication with Friends and Family: Not on file  . Frequency of Social Gatherings with Friends and Family: Not on file  . Attends Religious Services: Not on file  . Active Member of Clubs or Organizations: Not on file  . Attends Archivist Meetings: Not on file  . Marital Status: Not on file     Family History: The patient's family history includes Alcohol abuse in his father; Arthritis in his father; Cancer in his mother; Early death in his mother; Heart disease in his father; Hyperlipidemia in his father; Hypertension in his father.  ROS:   Please see the history of present illness.     All other systems reviewed and are negative.  EKGs/Labs/Other Studies Reviewed:    The following studies were reviewed today:   EKG:  Nov. 9, 2020 :  NSR at 54,   Inf. MI ,    Recent Labs: 05/14/2019: ALT 24 08/25/2019: BUN 23; Creatinine, Ser 1.27; Potassium 4.6; Sodium 138  Recent Lipid Panel    Component Value Date/Time   CHOL 118 05/14/2019 1312   TRIG 148.0 05/14/2019 1312   HDL 41.00 05/14/2019 1312   CHOLHDL 3 05/14/2019 1312   VLDL 29.6 05/14/2019 1312   LDLCALC 48 05/14/2019 1312    Physical Exam:    Physical Exam: Blood pressure 114/70, pulse 67, height 6\' 3"  (1.905 m), weight (!) 335 lb (152 kg), SpO2 95 %.  GEN: Middle-aged gentleman, no acute distress. HEENT: Normal NECK: No JVD; No carotid bruits LYMPHATICS: No lymphadenopathy CARDIAC: RRR , no murmurs, rubs, gallops RESPIRATORY:  Clear to auscultation without rales, wheezing or rhonchi  ABDOMEN: Soft, non-tender,  non-distended MUSCULOSKELETAL:  No edema; No deformity  SKIN: Warm and dry NEUROLOGIC:  Alert and oriented x 3     ASSESSMENT:    1. Essential hypertension    PLAN:  In order of problems listed above:  1. Essential hypertension: His blood pressure is much better now.  His edema has resolved also when we reduce his dose of amlodipine and started HCTZ.  Continue current medications.  I encouraged him to work on diet, exercise, weight loss program.  He is working 16-hour days at his [employer] so he is not able to exercise much.  Dr. Martinique wanted a basic metabolic profile.  We will get a basic metabolic profile later this week and will fax the results to her.  ( lab is closed tonight )    2.  Hyperlipidemia: Can continue current medications.  Will draw lipids at his next office visit.    Medication Adjustments/Labs and Tests Ordered: Current medicines are reviewed at length with the patient today.  Concerns regarding medicines are outlined above.  Orders Placed This Encounter  Procedures  . Basic metabolic panel   No orders of the defined types were placed in this encounter.   Patient Instructions  Medication Instructions:  Your physician recommends that you continue on your current medications as directed. Please refer to the Current Medication list given to you today.  *If you need a refill on your cardiac medications before your next appointment, please call your pharmacy*  Lab Work: BMET this week  If you have labs (blood work) drawn today and your tests are completely normal, you will receive your results only by: Marland Kitchen MyChart Message (if you have MyChart) OR . A paper copy in the mail If you have any lab test that is abnormal or we need to change your treatment, we will call you to review the results.  Testing/Procedures: None  Follow-Up: At [facility], you and your health needs are our priority.  As part of our continuing mission to provide you with  exceptional heart care, we have created designated Provider Care Teams.  These Care Teams include your primary Cardiologist (physician) and Advanced Practice Providers (APPs -  Physician Assistants and Nurse Practitioners) who all work together to provide you with the care you need, when you need it.  Your next appointment:   6 month(s)  The format for your next appointment:   In Person  Provider:   You may see Dr. Mertie Moores or one of the following Advanced Practice Providers on your designated Care Team:    Richardson Dopp, PA-C  [place], [place]  Daune Perch, NP   Other Instructions  To get set up for your Covid Vaccine go to [email or web address] and fill out the form.  Someone will contact you to get you scheduled.      Signed, Mertie Moores, MD  11/03/2019 5:09 PM    [place]

## 2019-11-03 NOTE — Patient Instructions (Signed)
Medication Instructions:  Your physician recommends that you continue on your current medications as directed. Please refer to the Current Medication list given to you today.  *If you need a refill on your cardiac medications before your next appointment, please call your pharmacy*  Lab Work: BMET this week  If you have labs (blood work) drawn today and your tests are completely normal, you will receive your results only by: Marland Kitchen MyChart Message (if you have MyChart) OR . A paper copy in the mail If you have any lab test that is abnormal or we need to change your treatment, we will call you to review the results.  Testing/Procedures: None  Follow-Up: At [facility], you and your health needs are our priority.  As part of our continuing mission to provide you with exceptional heart care, we have created designated Provider Care Teams.  These Care Teams include your primary Cardiologist (physician) and Advanced Practice Providers (APPs -  Physician Assistants and Nurse Practitioners) who all work together to provide you with the care you need, when you need it.  Your next appointment:   6 month(s)  The format for your next appointment:   In Person  Provider:   You may see Dr. Mertie Moores or one of the following Advanced Practice Providers on your designated Care Team:    Richardson Dopp, PA-C  [place], [place]  Daune Perch, NP   Other Instructions  To get set up for your Covid Vaccine go to [email or web address] and fill out the form.  Someone will contact you to get you scheduled.

## 2019-11-05 ENCOUNTER — Other Ambulatory Visit: Payer: Self-pay

## 2019-11-05 ENCOUNTER — Other Ambulatory Visit: Payer: 59 | Admitting: *Deleted

## 2019-11-05 DIAGNOSIS — I10 Essential (primary) hypertension: Secondary | ICD-10-CM

## 2019-11-05 LAB — BASIC METABOLIC PANEL
BUN/Creatinine Ratio: 18 (ref 9–20)
BUN: 24 mg/dL (ref 6–24)
CO2: 23 mmol/L (ref 20–29)
Calcium: 9.2 mg/dL (ref 8.7–10.2)
Chloride: 98 mmol/L (ref 96–106)
Creatinine, Ser: 1.33 mg/dL — ABNORMAL HIGH (ref 0.76–1.27)
GFR calc Af Amer: 69 mL/min/{1.73_m2} (ref >59)
GFR calc non Af Amer: 60 mL/min/{1.73_m2} (ref >59)
Glucose: 233 mg/dL — ABNORMAL HIGH (ref 65–99)
Potassium: 4.3 mmol/L (ref 3.5–5.2)
Sodium: 134 mmol/L (ref 134–144)

## 2019-11-17 ENCOUNTER — Other Ambulatory Visit: Payer: Self-pay | Admitting: Family Medicine

## 2019-11-17 DIAGNOSIS — I10 Essential (primary) hypertension: Secondary | ICD-10-CM

## 2020-01-18 LAB — HM DIABETES EYE EXAM

## 2020-01-19 ENCOUNTER — Encounter: Payer: Self-pay | Admitting: Family Medicine

## 2020-02-25 ENCOUNTER — Telehealth: Payer: Self-pay | Admitting: Family Medicine

## 2020-02-25 NOTE — Telephone Encounter (Signed)
pt spouse called stating pt can have CPE done before dec due to cheaper deductible and pt spoke with insurance stated they will cover it. I cancelled appt in dec and made appt for June 2021. I also made spouse aware that this will not be able to dispute bill if insurance do not cover. pt understands.

## 2020-03-22 ENCOUNTER — Encounter: Payer: Self-pay | Admitting: Family Medicine

## 2020-03-22 ENCOUNTER — Other Ambulatory Visit: Payer: Self-pay

## 2020-03-22 ENCOUNTER — Ambulatory Visit (INDEPENDENT_AMBULATORY_CARE_PROVIDER_SITE_OTHER): Payer: 59 | Admitting: Family Medicine

## 2020-03-22 VITALS — BP 118/72 | HR 76 | Temp 96.7°F | Resp 16 | Ht 75.0 in | Wt 335.4 lb

## 2020-03-22 DIAGNOSIS — E785 Hyperlipidemia, unspecified: Secondary | ICD-10-CM | POA: Diagnosis not present

## 2020-03-22 DIAGNOSIS — I10 Essential (primary) hypertension: Secondary | ICD-10-CM | POA: Diagnosis not present

## 2020-03-22 DIAGNOSIS — Z0000 Encounter for general adult medical examination without abnormal findings: Secondary | ICD-10-CM | POA: Diagnosis not present

## 2020-03-22 DIAGNOSIS — E1122 Type 2 diabetes mellitus with diabetic chronic kidney disease: Secondary | ICD-10-CM

## 2020-03-22 DIAGNOSIS — E1169 Type 2 diabetes mellitus with other specified complication: Secondary | ICD-10-CM | POA: Diagnosis not present

## 2020-03-22 DIAGNOSIS — G63 Polyneuropathy in diseases classified elsewhere: Secondary | ICD-10-CM

## 2020-03-22 LAB — LIPID PANEL
Cholesterol: 109 mg/dL (ref 0–200)
HDL: 39.7 mg/dL (ref >39.00)
LDL Cholesterol: 45 mg/dL (ref 0–99)
NonHDL: 69.26
Total CHOL/HDL Ratio: 3
Triglycerides: 123 mg/dL (ref 0.0–149.0)
VLDL: 24.6 mg/dL (ref 0.0–40.0)

## 2020-03-22 LAB — GLUCOSE, POCT (MANUAL RESULT ENTRY): POC Glucose: 114 mg/dl — AB (ref 70–99)

## 2020-03-22 LAB — COMPREHENSIVE METABOLIC PANEL
ALT: 25 U/L (ref 0–53)
AST: 15 U/L (ref 0–37)
Albumin: 4.3 g/dL (ref 3.5–5.2)
Alkaline Phosphatase: 59 U/L (ref 39–117)
BUN: 20 mg/dL (ref 6–23)
CO2: 30 mEq/L (ref 19–32)
Calcium: 9.5 mg/dL (ref 8.4–10.5)
Chloride: 101 mEq/L (ref 96–112)
Creatinine, Ser: 1.2 mg/dL (ref 0.40–1.50)
GFR: 62.58 mL/min (ref >60.00)
Glucose, Bld: 121 mg/dL — ABNORMAL HIGH (ref 70–99)
Potassium: 4.6 mEq/L (ref 3.5–5.1)
Sodium: 138 mEq/L (ref 135–145)
Total Bilirubin: 0.6 mg/dL (ref 0.2–1.2)
Total Protein: 6.3 g/dL (ref 6.0–8.3)

## 2020-03-22 LAB — HEMOGLOBIN A1C: Hgb A1c MFr Bld: 6.9 % — ABNORMAL HIGH (ref 4.6–6.5)

## 2020-03-22 NOTE — Progress Notes (Signed)
HPI: [name] is a 54 y.o.male here today for his routine physical examination.  Last CPE: 08/25/19 He lives alone. His wife visits regularly, she is in [place].She will move with him once she retires.  Regular exercise 3 or more times per week: Not regularly due to work schedule. Following a healthy diet: Eating more vegetables,fish. He decreased sweets intake.  Chronic medical problems: OSA,DM II,HLD,peripheral neuropathy, and CAD among some.  Immunization History  Administered Date(s) Administered  . Influenza,inj,Quad PF,6+ Mos 08/25/2019  . Moderna SARS-COVID-2 Vaccination 11/30/2019   -Hep C screening: 04/2017 NR.  Last colon cancer screening:09/04/17, he is due in 2023. Last prostate ca screening: Follows with urologist. PSA on 07/09/19 normal at 0.3.  Negative for high alcohol intake, tobacco use, or Hx of illicit drug use.  -Concerns and/or follow up today: He needs labs, including HgA1C. He needs biometric form complete for insurance purposes.  DM II: Follows with endocrinologist. BS 110's. Negative for polydipsia,polyuria, or polyphagia. He is on Rybelsus 14 mg daily,Farxiga 10 mg daily,and Metformin 850 mg bid.  Last FLP in 05/2019.  HLD: He is on Atorvastatin 40 mg daily. CAD, follows with cardiologist.  HTN: He is on Lisinopril 20 mg daily, Amlodipine 5 mg daily,and HCTZ 25 mg daily.  Review of Systems  Constitutional: Positive for fatigue. Negative for activity change, appetite change and fever.  HENT: Negative for dental problem, mouth sores, nosebleeds and sore throat.   Eyes: Negative for redness and visual disturbance.  Respiratory: Negative for cough, shortness of breath and wheezing.   Cardiovascular: Negative for chest pain, palpitations and leg swelling.  Gastrointestinal: Negative for abdominal pain, blood in stool, nausea and vomiting.  Endocrine: Negative for cold intolerance and heat intolerance.  Genitourinary: Negative for  decreased urine volume, dysuria, genital sores, hematuria and testicular pain.  Musculoskeletal: Negative for gait problem and myalgias.  Skin: Negative for color change and rash.  Neurological: Negative for syncope, weakness and headaches.  Hematological: Negative for adenopathy. Does not bruise/bleed easily.  Psychiatric/Behavioral: Negative for behavioral problems and confusion.  All other systems reviewed and are negative.  Current Outpatient Medications on File Prior to Visit  Medication Sig Dispense Refill  . amLODipine (NORVASC) 5 MG tablet Take 1 tablet (5 mg total) by mouth daily. 90 tablet 3  . atorvastatin (LIPITOR) 40 MG tablet TAKE 1 TAB BY MOUTH AT BEDTIME 90 tablet 3  . FARXIGA 10 MG TABS tablet Take 10 mg by mouth daily.    . hydrochlorothiazide (HYDRODIURIL) 25 MG tablet Take 1 tablet (25 mg total) by mouth daily. 30 tablet 11  . lisinopril (ZESTRIL) 20 MG tablet TAKE 1 TABLET BY MOUTH EVERY DAY 90 tablet 3  . metFORMIN (GLUCOPHAGE) 850 MG tablet Take 850 mg by mouth daily.    . pregabalin (LYRICA) 200 MG capsule Take 200 mg by mouth 2 (two) times daily.    . Semaglutide (RYBELSUS) 14 MG TABS Take 1 tablet by mouth daily.    . potassium chloride (KLOR-CON) 10 MEQ tablet Take 1 tablet (10 mEq total) by mouth daily. 30 tablet 11   No current facility-administered medications on file prior to visit.   Past Medical History:  Diagnosis Date  . Allergy    seasonal allergies  . Blood in stool    history of blood in stool  . Cancer [facility]) 2008   kidney  . Chronic kidney disease   . Depression   . Diabetes mellitus without complication ([place])   .  Heart murmur   . History of colon polyps   . Hypertension     Past Surgical History:  Procedure Laterality Date  . [place]  . 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 NOSE SURGERY  2015    No Known Allergies  Family History  Problem Relation Age of Onset  . Cancer Mother   . Early  death Mother   . Arthritis Father   . Alcohol abuse Father   . Heart disease Father   . Hyperlipidemia Father   . Hypertension Father     Social History   Socioeconomic History  . Marital status: Married    Spouse name: Not on file  . Number of children: 2  . Years of education: Not on file  . Highest education level: Not on file  Occupational History  . Not on file  Tobacco Use  . Smoking status: Never Smoker  . Smokeless tobacco: Never Used  Vaping Use  . Vaping Use: Never used  Substance and Sexual Activity  . Alcohol use: Yes  . Drug use: Never  . Sexual activity: Yes  Other Topics Concern  . Not on file  Social History Narrative  . Not on file   Social Determinants of Health   Financial Resource Strain:   . Difficulty of Paying Living Expenses:   Food Insecurity:   . Worried About Charity fundraiser in the Last Year:   . Arboriculturist in the Last Year:   Transportation Needs:   . Film/video editor (Medical):   Marland Kitchen Lack of Transportation (Non-Medical):   Physical Activity:   . Days of Exercise per Week:   . Minutes of Exercise per Session:   Stress:   . Feeling of Stress :   Social Connections:   . Frequency of Communication with Friends and Family:   . Frequency of Social Gatherings with Friends and Family:   . Attends Religious Services:   . Active Member of Clubs or Organizations:   . Attends Archivist Meetings:   Marland Kitchen Marital Status:    Vitals:   03/22/20 0834  BP: 118/72  Pulse: 76  Resp: 16  Temp: (!) 96.7 F (35.9 C)  SpO2: 97%   Body mass index is 41.92 kg/m.  Wt Readings from Last 3 Encounters:  03/22/20 (!) 335 lb 6 oz (152.1 kg)  11/03/19 (!) 335 lb (152 kg)  08/25/19 (!) 338 lb 12.8 oz (153.7 kg)   Physical Exam Vitals and nursing note reviewed.  Constitutional:      General: He is not in acute distress.    Appearance: He is well-developed and well-groomed.  HENT:     Head: Normocephalic and atraumatic.      Right Ear: Tympanic membrane, ear canal and external ear normal.     Left Ear: Tympanic membrane, ear canal and external ear normal.     Mouth/Throat:     Mouth: Mucous membranes are moist.     Pharynx: Oropharynx is clear.  Eyes:     Extraocular Movements: Extraocular movements intact.     Conjunctiva/sclera: Conjunctivae normal.     Pupils: Pupils are equal, round, and reactive to light.  Neck:     Thyroid: No thyromegaly or thyroid tenderness.     Trachea: No tracheal deviation.  Cardiovascular:     Rate and Rhythm: Normal rate and regular rhythm.     Pulses:  Dorsalis pedis pulses are 2+ on the right side and 2+ on the left side.     Heart sounds: No murmur heard.      Comments: Trace pitting LE edema,bilateral. Pulmonary:     Effort: Pulmonary effort is normal. No respiratory distress.     Breath sounds: Normal breath sounds.  Abdominal:     Palpations: Abdomen is soft. There is no hepatomegaly or mass.     Tenderness: There is no abdominal tenderness.  Genitourinary:    Comments: Deferred to urologist. Musculoskeletal:        General: No tenderness.     Cervical back: Normal range of motion.     Comments: No signs of synovitis.  Lymphadenopathy:     Cervical: No cervical adenopathy.     Upper Body:     Right upper body: No supraclavicular adenopathy.     Left upper body: No supraclavicular adenopathy.  Skin:    General: Skin is warm and moist.     Findings: No erythema or rash.  Neurological:     Mental Status: He is alert and oriented to person, place, and time.     Cranial Nerves: No cranial nerve deficit.     Sensory: No sensory deficit.     Coordination: Coordination normal.     Gait: Gait normal.     Deep Tendon Reflexes:     Reflex Scores:      Bicep reflexes are 2+ on the right side and 2+ on the left side.      Patellar reflexes are 2+ on the right side and 2+ on the left side. Psychiatric:        Mood and Affect: Mood and affect normal.         Behavior: Behavior normal.        Cognition and Memory: Cognition normal.     Comments: Good eye contact.    ASSESSMENT AND PLAN:  Mr.Kadon was seen today for annual exam.  Diagnoses and all orders for this visit:  Orders Placed This Encounter  Procedures  . Comprehensive metabolic panel  . Lipid panel  . Fructosamine  . Hemoglobin A1c  . POC Glucose (CBG)   Lab Results  Component Value Date   HGBA1C 6.9 (H) 03/22/2020   Lab Results  Component Value Date   CHOL 109 03/22/2020   HDL 39.70 03/22/2020   LDLCALC 45 03/22/2020   TRIG 123.0 03/22/2020   CHOLHDL 3 03/22/2020   Lab Results  Component Value Date   CREATININE 1.20 03/22/2020   BUN 20 03/22/2020   NA 138 03/22/2020   K 4.6 03/22/2020   CL 101 03/22/2020   CO2 30 03/22/2020   Lab Results  Component Value Date   ALT 25 03/22/2020   AST 15 03/22/2020   ALKPHOS 59 03/22/2020   BILITOT 0.6 03/22/2020    Routine general medical examination at a health care facility We discussed the importance of regular physical activity and healthy diet for prevention of chronic illness and/or complications. Preventive guidelines reviewed. Vaccination up to date.  Next CPE in a year.  Benign essential hypertension BP adequately controlled. Low salt diet to continue. No changes in current management. Follows with cardiologist.  Hyperlipidemia associated with type 2 diabetes mellitus ([place]) No changes in current management, will give further recommendations according to lab results.  Type 2 diabetes mellitus with chronic kidney disease, without long-term current use of insulin, unspecified CKD stage ([place]) Last HgA1C was al goal, 6.4. No changes in  current management, will make further recommendations according to HgA1C result.  Regular physical activity and wt loss will help with better glucose controlled. Continue following with endocrinologist.  Polyneuropathy associated with underlying disease ([place]) Appropriate  food care, avoid trauma. Adequate glucose controlled. Continue Lyrica.  Morbid obesity ([place]) Lost about 3 Lb since his last visit. Encouraged to be consistent with regular physical activity and a healthful diet.  Benefits of wt loss discussed.   Return in 1 year (on 03/22/2021) for CPE.  Kirstina Leinweber G. Martinique, MD  [facility]. [place] office.   A few things to remember from today's visit:   Benign essential hypertension - Plan: Comprehensive metabolic panel  Hyperlipidemia associated with type 2 diabetes mellitus ([place]) - Plan: Lipid panel  Type 2 diabetes mellitus with chronic kidney disease, without long-term current use of insulin, unspecified CKD stage ([place]) - Plan: Fructosamine, Hemoglobin A1c  Routine general medical examination at a health care facility  If you need refills please call your pharmacy. Do not use My Chart to request refills or for acute issues that need immediate attention.   Please be sure medication list is accurate. If a new problem present, please set up appointment sooner than planned today.  At least 150 minutes of moderate exercise per week, daily brisk walking for 15-30 min is a good exercise option. Healthy diet low in saturated (animal) fats and sweets and consisting of fresh fruits and vegetables, lean meats such as fish and white chicken and whole grains.  - Vaccines:  Tdap vaccine every 10 years.  Shingles vaccine recommended at age 33, could be given after 56 years of age but not sure about insurance coverage.  Pneumonia vaccines: Pneumovax at 48   -Screening recommendations for low/normal risk males:  Screening for diabetes at age 6 and every 3 years. Earlier screening if cardiovascular risk factors.N/A   Lipid screening at 35 and every 3 years. Screening starts in younger males with cardiovascular risk factors.N/A  Colon cancer screening is now at age 4 but your insurance may not cover until age 26 .screening is  recommended age 39.  Prostate cancer screening: some controversy, starts usually at 72: Rectal exam and PSA.  Aortic Abdominal Aneurism once between 38 and 63 years old if ever smoker.  Also recommended:  1. Dental visit- Brush and floss your teeth twice daily; visit your dentist twice a year. 2. Eye doctor- Get an eye exam at least every 2 years. 3. Helmet use- Always wear a helmet when riding a bicycle, motorcycle, rollerblading or skateboarding. 4. Safe sex- If you may be exposed to sexually transmitted infections, use a condom. 5. Seat belts- Seat belts can save your live; always wear one. 6. Smoke/Carbon Monoxide detectors- These detectors need to be installed on the appropriate level of your home. Replace batteries at least once a year. 7. Skin cancer- When out in the sun please cover up and use sunscreen 15 SPF or higher. 8. Violence- If anyone is threatening or hurting you, please tell your healthcare provider.  9. Drink alcohol in moderation- Limit alcohol intake to one drink or less per day. Never drink and drive.

## 2020-03-22 NOTE — Patient Instructions (Addendum)
A few things to remember from today's visit:   Benign essential hypertension - Plan: Comprehensive metabolic panel  Hyperlipidemia associated with type 2 diabetes mellitus ([place]) - Plan: Lipid panel  Type 2 diabetes mellitus with chronic kidney disease, without long-term current use of insulin, unspecified CKD stage ([place]) - Plan: Fructosamine, Hemoglobin A1c  Routine general medical examination at a health care facility  If you need refills please call your pharmacy. Do not use My Chart to request refills or for acute issues that need immediate attention.   Please be sure medication list is accurate. If a new problem present, please set up appointment sooner than planned today.  At least 150 minutes of moderate exercise per week, daily brisk walking for 15-30 min is a good exercise option. Healthy diet low in saturated (animal) fats and sweets and consisting of fresh fruits and vegetables, lean meats such as fish and white chicken and whole grains.  - Vaccines:  Tdap vaccine every 10 years.  Shingles vaccine recommended at age 37, could be given after 56 years of age but not sure about insurance coverage.  Pneumonia vaccines: Pneumovax at 31   -Screening recommendations for low/normal risk males:  Screening for diabetes at age 27 and every 3 years. Earlier screening if cardiovascular risk factors.N/A   Lipid screening at 35 and every 3 years. Screening starts in younger males with cardiovascular risk factors.N/A  Colon cancer screening is now at age 64 but your insurance may not cover until age 4 .screening is recommended age 53.  Prostate cancer screening: some controversy, starts usually at 57: Rectal exam and PSA.  Aortic Abdominal Aneurism once between 34 and 81 years old if ever smoker.  Also recommended:  1. Dental visit- Brush and floss your teeth twice daily; visit your dentist twice a year. 2. Eye doctor- Get an eye exam at least every 2 years. 3. Helmet use-  Always wear a helmet when riding a bicycle, motorcycle, rollerblading or skateboarding. 4. Safe sex- If you may be exposed to sexually transmitted infections, use a condom. 5. Seat belts- Seat belts can save your live; always wear one. 6. Smoke/Carbon Monoxide detectors- These detectors need to be installed on the appropriate level of your home. Replace batteries at least once a year. 7. Skin cancer- When out in the sun please cover up and use sunscreen 15 SPF or higher. 8. Violence- If anyone is threatening or hurting you, please tell your healthcare provider.  9. Drink alcohol in moderation- Limit alcohol intake to one drink or less per day. Never drink and drive.

## 2020-03-23 DIAGNOSIS — G63 Polyneuropathy in diseases classified elsewhere: Secondary | ICD-10-CM | POA: Insufficient documentation

## 2020-03-29 LAB — FRUCTOSAMINE: Fructosamine: 262 umol/L (ref 205–285)

## 2020-06-28 ENCOUNTER — Other Ambulatory Visit: Payer: Self-pay

## 2020-06-28 ENCOUNTER — Encounter: Payer: Self-pay | Admitting: Pulmonary Disease

## 2020-06-28 ENCOUNTER — Ambulatory Visit (INDEPENDENT_AMBULATORY_CARE_PROVIDER_SITE_OTHER): Payer: 59 | Admitting: Pulmonary Disease

## 2020-06-28 DIAGNOSIS — G4733 Obstructive sleep apnea (adult) (pediatric): Secondary | ICD-10-CM | POA: Diagnosis not present

## 2020-06-28 MED ORDER — ZOLPIDEM TARTRATE 10 MG PO TABS: 10.0000 mg | ORAL_TABLET | Freq: Every evening | ORAL | 2 refills | Status: AC | PRN

## 2020-06-28 NOTE — Addendum Note (Signed)
Addended by: Satira Sark D on: 06/28/2020 02:47 PM   Modules accepted: Orders

## 2020-06-28 NOTE — Progress Notes (Addendum)
   Subjective:    Patient ID: Wayne Larsen, male    DOB: 1966-01-11, 54 y.o.   MRN: [id number]  HPI   54 yo for FU of  obstructive sleep apnea , on BiPAP, auto settings  He had a sleep study done in [place] before moving to [place].  We have not been unable to locate the original sleep study. He is doing very well on auto BiPAP settings, last time we checked a download average pressure was 16/12.  We discontinued ramp time and he has settled down well with this. He had sinus surgery in the interim , and uses nasal spray every night at bedtime.  No snoring has been noted by his wife. 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 Also denies presyncope, palpitations, heartburn, abdominal pain, nausea, vomiting, diarrhea or change in bowel or urinary habits, dysuria,hematuria, rash, arthralgias, visual complaints, headache, numbness weakness or ataxia.     Objective:   Physical Exam  Gen. Pleasant, obese, in no distress ENT - no lesions, no post nasal drip Neck: No JVD, no thyromegaly, no carotid bruits Lungs: no use of accessory muscles, no dullness to percussion, decreased without rales or rhonchi  Cardiovascular: Rhythm regular, heart sounds  normal, no murmurs or gallops, no peripheral edema Musculoskeletal: No deformities, no cyanosis or clubbing , no tremors       Assessment & Plan:

## 2020-06-28 NOTE — Addendum Note (Signed)
Addended by: Satira Sark D on: 06/28/2020 02:48 PM   Modules accepted: Orders

## 2020-06-28 NOTE — Patient Instructions (Signed)
  BiPAP supplies will be renewed for a year. I will ask DME to check report on your machine.  Refill on Ambien # 30

## 2020-06-28 NOTE — Assessment & Plan Note (Signed)
BiPAP seems to be working well and he is compliant by history. We will ask DME to obtain a download for [place] so that we can review and adjust settings as needed.  He is on auto BiPAP settings starting at 10 cm.  He has no problems with mask or pressure. BiPAP is certainly helped improve his daytime somnolence and fatigue and he seems to be compliant. Supplies will be renewed for a year we will try to locate his original sleep study from his physician in [place] Weight loss encouraged, compliance with goal of at least 4-6 hrs every night is the expectation. Advised against medications with sedative side effects Cautioned against driving when sleepy - understanding that sleepiness will vary on a day to day basis

## 2020-06-28 NOTE — Addendum Note (Signed)
Addended by: Rigoberto Noel on: 06/28/2020 02:25 PM   Modules accepted: Orders

## 2020-06-30 ENCOUNTER — Telehealth: Payer: Self-pay | Admitting: Pulmonary Disease

## 2020-06-30 NOTE — Telephone Encounter (Signed)
Called and spoke with Wayne Larsen at [facility]. She stated that the RX was filled on 06/28/20 and is waiting for pickup.   Called and spoke with patient. He stated that he had called yesterday and they told him that he needed to follow up with [facility] since they did not receive the RX. He is aware that the RX is ready.   Nothing further needed at time of call.

## 2020-07-04 ENCOUNTER — Other Ambulatory Visit: Payer: Self-pay | Admitting: Cardiovascular Disease

## 2020-07-06 ENCOUNTER — Other Ambulatory Visit: Payer: Self-pay | Admitting: Cardiovascular Disease

## 2020-07-06 DIAGNOSIS — E1169 Type 2 diabetes mellitus with other specified complication: Secondary | ICD-10-CM

## 2020-07-06 DIAGNOSIS — E785 Hyperlipidemia, unspecified: Secondary | ICD-10-CM

## 2020-08-21 ENCOUNTER — Other Ambulatory Visit: Payer: Self-pay | Admitting: Cardiovascular Disease

## 2020-08-21 ENCOUNTER — Encounter: Payer: Self-pay | Admitting: Cardiovascular Disease

## 2020-08-21 DIAGNOSIS — E785 Hyperlipidemia, unspecified: Secondary | ICD-10-CM

## 2020-08-21 DIAGNOSIS — E1169 Type 2 diabetes mellitus with other specified complication: Secondary | ICD-10-CM

## 2020-08-21 NOTE — Progress Notes (Signed)
Cardiology Office Note:    Date:  08/22/2020   ID:  Wayne Larsen, DOB 1966-04-13, MRN [id number]  PCP:  Martinique, Betty G, MD  Cardiologist:  Mertie Moores, MD  Electrophysiologist:  None   Referring MD: Martinique, Betty G, MD   Chief Complaint  Patient presents with  . Hypertension       Wayne Larsen is a 54 y.o. male with a hx of hypertension, hyperlipidemia.  He recently moved from [place] and is here to establish care.  We were asked to see him for his HTN and hyperlipidemia by Dr. [name] .   Hx of renal cell cancer - s/p right nephrectomy    Originally from [place] .  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  Has been told that had a MI years ago.  Has not had cath or stenting .   Fairly active now.   Luz Lex quite a bit .  [place] .   Trying to start an exercise program .  Works 16 hour days .   No CP , works in yard without any issues.  Eats quite a bit out on the road.  And when he is at home he cooks foods that are typically preprepared.  November 03, 2019:  BP is much better ,  Edema is much better after reducing the amlodipine.  Is on  We added HCTZ and kdur  BP is much better  Wt is 335 lbs.  ( down 7 lbs from previous visit in Nov. 2021)   He is feeling much better.  He admits that he still eats too much.  Nov. 30, 2021: Seen today for follow up of HTN and HLD  Wt. Is 337 lbs  ( up 2 lbs from previous )   Lipid profile from March 22, 2020 looks good.  His total cholesterol is 109.  HDL is 39.  LDL is 45.  Triglyceride levels 123.  Does not work for [employer] any longer , had to travel too much , is currently between jobs.   No leg swelling  No chest pain  Is exercising regularly    Past Medical History:  Diagnosis Date  . Allergy    seasonal  allergies  . Blood in stool    history of blood in stool  . Cancer [facility]) 2008   kidney  . Chronic kidney disease   . Depression   . Diabetes mellitus without complication ([place])   . Heart murmur   . History of colon polyps   . Hypertension     Past Surgical History:  Procedure Laterality Date  . [place]  . 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 NOSE SURGERY  2015    Current Medications: Current Meds  Medication Sig  . amLODipine (NORVASC) 5 MG tablet TAKE 1 TABLET BY MOUTH EVERY DAY  . atorvastatin (LIPITOR) 40 MG tablet TAKE 1 TABLET BY MOUTH EVERYDAY AT BEDTIME  . FARXIGA 10 MG TABS tablet Take 10 mg by mouth daily.  . hydrochlorothiazide (HYDRODIURIL) 25 MG tablet TAKE 1 TABLET BY MOUTH EVERY DAY  . lisinopril (ZESTRIL) 20 MG tablet TAKE 1 TABLET BY MOUTH EVERY DAY  . metFORMIN (GLUCOPHAGE) 850 MG tablet Take 1,700 mg  by mouth daily.   . potassium chloride (KLOR-CON) 10 MEQ tablet TAKE 1 TABLET BY MOUTH EVERY DAY  . pregabalin (LYRICA) 200 MG capsule Take 200 mg by mouth 2 (two) times daily.  . Semaglutide (RYBELSUS) 14 MG TABS Take 1 tablet by mouth daily.  . Semaglutide,0.25 or 0.5MG /DOS, (OZEMPIC, 0.25 OR 0.5 MG/DOSE,) 2 MG/1.5ML SOPN Inject into the skin once a week. Every monday  . zolpidem (AMBIEN) 10 MG tablet Take 1 tablet (10 mg total) by mouth at bedtime as needed for sleep.     Allergies:   Patient has no known allergies.   Social History   Socioeconomic History  . Marital status: Married    Spouse name: Not on file  . Number of children: 2  . Years of education: Not on file  . Highest education level: Not on file  Occupational History  . Not on file  Tobacco Use  . Smoking status: Never Smoker  . Smokeless tobacco: Never Used  Vaping Use  . Vaping Use: Never used  Substance and Sexual Activity  . Alcohol use: Yes  . Drug use: Never  . Sexual activity: Yes  Other Topics Concern  . Not on file  Social  History Narrative  . Not on file   Social Determinants of Health   Financial Resource Strain:   . Difficulty of Paying Living Expenses: Not on file  Food Insecurity:   . Worried About Charity fundraiser in the Last Year: Not on file  . Ran Out of Food in the Last Year: Not on file  Transportation Needs:   . Lack of Transportation (Medical): Not on file  . Lack of Transportation (Non-Medical): Not on file  Physical Activity:   . Days of Exercise per Week: Not on file  . Minutes of Exercise per Session: Not on file  Stress:   . Feeling of Stress : Not on file  Social Connections:   . Frequency of Communication with Friends and Family: Not on file  . Frequency of Social Gatherings with Friends and Family: Not on file  . Attends Religious Services: Not on file  . Active Member of Clubs or Organizations: Not on file  . Attends Archivist Meetings: Not on file  . Marital Status: Not on file     Family History: The patient's family history includes Alcohol abuse in his father; Arthritis in his father; Cancer in his mother; Early death in his mother; Heart disease in his father; Hyperlipidemia in his father; Hypertension in his father.  ROS:   Please see the history of present illness.     All other systems reviewed and are negative.  EKGs/Labs/Other Studies Reviewed:    The following studies were reviewed today:   EKG:      NOv. 30, 2021:  NSR at 82.  NS ST /T wave changes No changes from previous ecg   Recent Labs: 03/22/2020: ALT 25; BUN 20; Creatinine, Ser 1.20; Potassium 4.6; Sodium 138  Recent Lipid Panel    Component Value Date/Time   CHOL 109 03/22/2020 0920   TRIG 123.0 03/22/2020 0920   HDL 39.70 03/22/2020 0920   CHOLHDL 3 03/22/2020 0920   VLDL 24.6 03/22/2020 0920   LDLCALC 45 03/22/2020 0920    Physical Exam:     Physical Exam: Blood pressure 130/82, pulse 82, height 6\' 3"  (1.905 m), weight (!) 337 lb (152.9 kg), SpO2 96 %.  GEN:  Middle  age man,  Moderately obese  HEENT: Normal NECK: No JVD; No carotid bruits LYMPHATICS: No lymphadenopathy CARDIAC: RRR , no murmurs, rubs, gallops RESPIRATORY:  Clear to auscultation without rales, wheezing or rhonchi  ABDOMEN: Soft, non-tender, non-distended MUSCULOSKELETAL:  No edema; No deformity  SKIN: Warm and dry NEUROLOGIC:  Alert and oriented x 3     ASSESSMENT:    1. Essential hypertension   2. Hyperlipidemia, unspecified hyperlipidemia type   3. Coronary artery disease involving native heart without angina pectoris, unspecified vessel or lesion type    PLAN:       1.  Essential hypertension:   BP is well controlled.   I've encouraged him to work on diet, exercise, weight loss Advised him to avoid foods that are white, wheat, and sweet.    2.  Hyperlipidemia:  Managed by primary md      Medication Adjustments/Labs and Tests Ordered: Current medicines are reviewed at length with the patient today.  Concerns regarding medicines are outlined above.  Orders Placed This Encounter  Procedures  . EKG 12-Lead   No orders of the defined types were placed in this encounter.    Patient Instructions  Medication Instructions:  Your physician recommends that you continue on your current medications as directed. Please refer to the Current Medication list given to you today.  *If you need a refill on your cardiac medications before your next appointment, please call your pharmacy*   Lab Work: None Ordered If you have labs (blood work) drawn today and your tests are completely normal, you will receive your results only by: Marland Kitchen MyChart Message (if you have MyChart) OR . A paper copy in the mail If you have any lab test that is abnormal or we need to change your treatment, we will call you to review the results.    Testing/Procedures: None Ordered    Follow-Up: At [facility], you and your health needs are our priority.  As part of our continuing mission to  provide you with exceptional heart care, we have created designated Provider Care Teams.  These Care Teams include your primary Cardiologist (physician) and Advanced Practice Providers (APPs -  Physician Assistants and Nurse Practitioners) who all work together to provide you with the care you need, when you need it.   Your next appointment:   1 year(s)  The format for your next appointment:   In Person  Provider:   You may see Mertie Moores, MD or one of the following Advanced Practice Providers on your designated Care Team:    Richardson Dopp, PA-C  Robbie Lis, [place]        Signed, Mertie Moores, MD  08/22/2020 4:18 PM    [place]

## 2020-08-22 ENCOUNTER — Encounter: Payer: Self-pay | Admitting: Cardiovascular Disease

## 2020-08-22 ENCOUNTER — Other Ambulatory Visit: Payer: Self-pay

## 2020-08-22 ENCOUNTER — Ambulatory Visit: Payer: 59 | Admitting: Cardiovascular Disease

## 2020-08-22 VITALS — BP 130/82 | HR 82 | Ht 75.0 in | Wt 337.0 lb

## 2020-08-22 DIAGNOSIS — E785 Hyperlipidemia, unspecified: Secondary | ICD-10-CM

## 2020-08-22 DIAGNOSIS — I2510 Atherosclerotic heart disease of native coronary artery without angina pectoris: Secondary | ICD-10-CM | POA: Diagnosis not present

## 2020-08-22 DIAGNOSIS — I10 Essential (primary) hypertension: Secondary | ICD-10-CM | POA: Diagnosis not present

## 2020-08-22 NOTE — Patient Instructions (Signed)

## 2020-08-25 ENCOUNTER — Encounter: Payer: 59 | Admitting: Family Medicine

## 2020-09-07 ENCOUNTER — Other Ambulatory Visit: Payer: Self-pay

## 2020-09-07 ENCOUNTER — Ambulatory Visit (INDEPENDENT_AMBULATORY_CARE_PROVIDER_SITE_OTHER): Payer: 59 | Admitting: *Deleted

## 2020-09-07 DIAGNOSIS — Z23 Encounter for immunization: Secondary | ICD-10-CM | POA: Diagnosis not present

## 2020-11-16 ENCOUNTER — Telehealth: Payer: Self-pay | Admitting: Cardiovascular Disease

## 2020-11-16 NOTE — Telephone Encounter (Signed)
Left detailed message for patient that he can call [facility] to request a different provider. Advised also that the cardiologists in our practice that treat sleep medicine are Drs. Radford Pax and Claiborne Billings. Advised him to call back for formal referral if needed or with additional questions.

## 2020-11-16 NOTE — Telephone Encounter (Signed)
Patient requesting a referral to a new sleep doctor. He states she sees Dr. Kara Mead at [facility] and states he does not read his machine.

## 2020-11-25 ENCOUNTER — Other Ambulatory Visit: Payer: Self-pay | Admitting: Family Medicine

## 2020-11-25 DIAGNOSIS — I10 Essential (primary) hypertension: Secondary | ICD-10-CM

## 2020-11-27 ENCOUNTER — Other Ambulatory Visit: Payer: Self-pay

## 2020-11-28 ENCOUNTER — Ambulatory Visit: Payer: 59 | Admitting: Family Medicine

## 2021-02-27 LAB — LIPID PANEL
Cholesterol: 119 (ref 0–200)
HDL: 37 (ref 35–70)
LDL Cholesterol: 51
LDl/HDL Ratio: 1.4
Triglycerides: 154 (ref 40–160)

## 2021-02-27 LAB — BASIC METABOLIC PANEL
BUN: 17 (ref 4–21)
CO2: 29 — AB (ref 13–22)
Chloride: 104 (ref 99–108)
Creatinine: 1.3 (ref 0.6–1.3)
Glucose: 130
Potassium: 4.4 (ref 3.4–5.3)
Sodium: 143 (ref 137–147)

## 2021-02-27 LAB — COMPREHENSIVE METABOLIC PANEL
Albumin: 4 (ref 3.5–5.0)
Calcium: 9.4 (ref 8.7–10.7)
Globulin: 2.6

## 2021-02-27 LAB — MICROALBUMIN, URINE: Microalb, Ur: 0.4

## 2021-02-27 LAB — HEPATIC FUNCTION PANEL
ALT: 40 (ref 10–40)
AST: 18 (ref 14–40)
Alkaline Phosphatase: 61 (ref 25–125)
Bilirubin, Total: 0.6

## 2021-02-27 LAB — HEMOGLOBIN A1C: Hemoglobin A1C: 6.7

## 2021-04-16 ENCOUNTER — Ambulatory Visit (INDEPENDENT_AMBULATORY_CARE_PROVIDER_SITE_OTHER): Payer: 59 | Admitting: Family Medicine

## 2021-04-16 ENCOUNTER — Other Ambulatory Visit: Payer: Self-pay

## 2021-04-16 ENCOUNTER — Encounter: Payer: Self-pay | Admitting: Family Medicine

## 2021-04-16 VITALS — BP 130/70 | HR 86 | Temp 97.8°F | Resp 16 | Ht 75.0 in | Wt 319.5 lb

## 2021-04-16 DIAGNOSIS — G4733 Obstructive sleep apnea (adult) (pediatric): Secondary | ICD-10-CM

## 2021-04-16 DIAGNOSIS — G63 Polyneuropathy in diseases classified elsewhere: Secondary | ICD-10-CM | POA: Diagnosis not present

## 2021-04-16 DIAGNOSIS — E785 Hyperlipidemia, unspecified: Secondary | ICD-10-CM

## 2021-04-16 DIAGNOSIS — E1169 Type 2 diabetes mellitus with other specified complication: Secondary | ICD-10-CM | POA: Diagnosis not present

## 2021-04-16 DIAGNOSIS — Z0000 Encounter for general adult medical examination without abnormal findings: Secondary | ICD-10-CM

## 2021-04-16 DIAGNOSIS — Z86010 Personal history of colonic polyps: Secondary | ICD-10-CM

## 2021-04-16 DIAGNOSIS — Z9989 Dependence on other enabling machines and devices: Secondary | ICD-10-CM

## 2021-04-16 DIAGNOSIS — I10 Essential (primary) hypertension: Secondary | ICD-10-CM

## 2021-04-16 NOTE — Patient Instructions (Addendum)
A few things to remember from today's visit:  Routine general medical examination at a health care facility  Polyneuropathy associated with underlying disease ([place]), Chronic  Hyperlipidemia associated with type 2 diabetes mellitus ([place]), Chronic  Morbid obesity ([place]), Chronic  History of colon polyps - Plan: Ambulatory referral to Gastroenterology  OSA on CPAP  Please let me know about sleep specialist in your net work.  At least 150 minutes of moderate exercise per week, daily brisk walking for 15-30 min is a good exercise option. Healthy diet low in saturated (animal) fats and sweets and consisting of fresh fruits and vegetables, lean meats such as fish and white chicken and whole grains.  - Vaccines:  Tdap vaccine every 10 years.  Shingles vaccine recommended at age 40, could be given after 57 years of age but not sure about insurance coverage.  Pneumonia vaccines: Pneumovax at 56  -Screening recommendations for low/normal risk males:  Screening for diabetes at age 40 and every 3 years. Earlier screening if cardiovascular risk factors.N/A  Lipid screening at 35 and every 3 years. Screening starts in younger males with cardiovascular risk factors.  Colon cancer screening is now at age 11 but your insurance may not cover until age 14 .screening is recommended age 91.  Prostate cancer screening: some controversy, starts usually at 41: Rectal exam and PSA.  Aortic Abdominal Aneurism once between 75 and 16 years old if ever smoker.  Also recommended:  Dental visit- Brush and floss your teeth twice daily; visit your dentist twice a year. Eye doctor- Get an eye exam at least every 2 years. Helmet use- Always wear a helmet when riding a bicycle, motorcycle, rollerblading or skateboarding. Safe sex- If you may be exposed to sexually transmitted infections, use a condom. Seat belts- Seat belts can save your live; always wear one. Smoke/Carbon Monoxide detectors- These detectors  need to be installed on the appropriate level of your home. Replace batteries at least once a year. Skin cancer- When out in the sun please cover up and use sunscreen 15 SPF or higher. Violence- If anyone is threatening or hurting you, please tell your healthcare provider.  Drink alcohol in moderation- Limit alcohol intake to one drink or less per day. Never drink and drive.  If you need refills please call your pharmacy. Do not use My Chart to request refills or for acute issues that need immediate attention.   Please be sure medication list is accurate. If a new problem present, please set up appointment sooner than planned today.

## 2021-04-16 NOTE — Progress Notes (Signed)
HPI:  [name] is a 55 y.o.male here today for his routine physical examination.  Last CPE: 03/22/20. He lives alone. His wife visits regularly, she is in [place].She will move with him once she retires. Sleeping about 6 hours.  Regular exercise 3 or more times per week: He is walking 3-4 miles daily. Following a healthy diet: He is trying to do better, he is planning on increasing fish intake. He has lost some wt, recently had blood work done and numbers have improved.  Chronic medical problems: OSA,DM II,HLD,polyneuropathy,BPH,CAD,insomnia,and HTN among some. DM II: Following with endocrinologist. Lab Results  Component Value Date   HGBA1C 6.7 02/27/2021   Immunization History  Administered Date(s) Administered   Influenza,inj,Quad PF,6+ Mos 08/25/2019, 09/07/2020   Moderna Sars-Covid-2 Vaccination 11/30/2019, 12/31/2019   PFIZER(Purple Top)SARS-COV-2 Vaccination 11/24/2019, 12/22/2019  Shingrix completed.  -Hep C screening: 04/2017 NR. Last colon cancer screening: Colonoscopy in 2019, polypectomy x 12. Last prostate ca screening: Follows with urologist annually. PSA in 07/2020 was 0.3. Nocturia x 0-1.  Negative for high alcohol intake, tobacco use, or Hx of illicit drug use.  -Concerns and/or follow up today:  He needs form completed for insurance purposes.  OSA: He would like a new sleep specialist. He is wearing his CPAP daily.  HTN on Amlodipine 5 mg daily,HCTZ 25 mg daily,lisinopril 20 mg daily. Lab Results  Component Value Date   CREATININE 1.3 02/27/2021   BUN 17 02/27/2021   NA 143 02/27/2021   K 4.4 02/27/2021   CL 104 02/27/2021   CO2 29 (A) 02/27/2021   HLD on Atorvastatin 40 mg daily. CAD. Lab Results  Component Value Date   CHOL 119 02/27/2021   HDL 37 02/27/2021   LDLCALC 51 02/27/2021   TRIG 154 02/27/2021   CHOLHDL 3 03/22/2020    Follows with cardiologist, Dr Cathie Olden.  Polyneuropathy on Lyrica 200 mg bid.  Insomnia on  Ambien 10 mg daily prn.  Review of Systems  Constitutional:  Negative for activity change, appetite change, fatigue and fever.  HENT:  Negative for dental problem, mouth sores, nosebleeds, sore throat, trouble swallowing and voice change.   Eyes:  Negative for redness and visual disturbance.  Respiratory:  Negative for cough, shortness of breath and wheezing.   Cardiovascular:  Negative for chest pain, palpitations and leg swelling.  Gastrointestinal:  Negative for abdominal pain, blood in stool, nausea and vomiting.  Endocrine: Negative for cold intolerance, heat intolerance, polydipsia, polyphagia and polyuria.  Genitourinary:  Negative for decreased urine volume, dysuria, genital sores, hematuria and testicular pain.  Musculoskeletal:  Positive for arthralgias. Negative for gait problem and myalgias.  Skin:  Negative for color change and rash.  Allergic/Immunologic: Positive for environmental allergies.  Neurological:  Negative for syncope, weakness and headaches.  Hematological:  Negative for adenopathy. Does not bruise/bleed easily.  Psychiatric/Behavioral:  Negative for confusion and hallucinations.    Current Outpatient Medications on File Prior to Visit  Medication Sig Dispense Refill   amLODipine (NORVASC) 5 MG tablet TAKE 1 TABLET BY MOUTH EVERY DAY 90 tablet 3   atorvastatin (LIPITOR) 40 MG tablet TAKE 1 TABLET BY MOUTH EVERYDAY AT BEDTIME 90 tablet 3   FARXIGA 10 MG TABS tablet Take 10 mg by mouth daily.     hydrochlorothiazide (HYDRODIURIL) 25 MG tablet TAKE 1 TABLET BY MOUTH EVERY DAY 90 tablet 3   lisinopril (ZESTRIL) 20 MG tablet TAKE 1 TABLET BY MOUTH EVERY DAY 90 tablet 1   metFORMIN (GLUCOPHAGE) 850  MG tablet Take 1,700 mg by mouth daily.      potassium chloride (KLOR-CON) 10 MEQ tablet TAKE 1 TABLET BY MOUTH EVERY DAY 90 tablet 3   pregabalin (LYRICA) 200 MG capsule Take 200 mg by mouth 2 (two) times daily.     Semaglutide (RYBELSUS) 14 MG TABS Take 1 tablet by mouth  daily.     zolpidem (AMBIEN) 10 MG tablet Take 1 tablet (10 mg total) by mouth at bedtime as needed for sleep. 30 tablet 2   No current facility-administered medications on file prior to visit.   Past Medical History:  Diagnosis Date   Allergy    seasonal allergies   Blood in stool    history of blood in stool   Cancer ([place]) 2008   kidney   Chronic kidney disease    Depression    Diabetes mellitus without complication ([place])    Heart murmur    History of colon polyps    Hypertension     Past Surgical History:  Procedure Laterality Date   BACK SURGERY  1989 and [place] [facility] [place] Left 2012   nephrectomy Right    NOSE SURGERY  2015    No Known Allergies  Family History  Problem Relation Age of Onset   Cancer Mother    Early death Mother    Arthritis Father    Alcohol abuse Father    Heart disease Father    Hyperlipidemia Father    Hypertension Father     Social History   Socioeconomic History   Marital status: Married    Spouse name: Not on file   Number of children: 2   Years of education: Not on file   Highest education level: Not on file  Occupational History   Not on file  Tobacco Use   Smoking status: Never   Smokeless tobacco: Never  Vaping Use   Vaping Use: Never used  Substance and Sexual Activity   Alcohol use: Yes   Drug use: Never   Sexual activity: Yes  Other Topics Concern   Not on file  Social History Narrative   Not on file   Social Determinants of Health   Financial Resource Strain: Not on file  Food Insecurity: Not on file  Transportation Needs: Not on file  Physical Activity: Not on file  Stress: Not on file  Social Connections: Not on file   Vitals:   04/16/21 1027  BP: 130/70  Pulse: 86  Resp: 16  Temp: 97.8 F (36.6 C)  SpO2: 98%   Body mass index is 39.93 kg/m.  Wt Readings from Last 3 Encounters:  04/16/21 (!) 319 lb 8 oz (144.9 kg)  08/22/20 (!) 337 lb (152.9 kg)  06/28/20  (!) 331 lb 12.8 oz (150.5 kg)   Physical Exam Vitals and nursing note reviewed.  Constitutional:      General: He is not in acute distress.    Appearance: He is well-developed.  HENT:     Head: Normocephalic and atraumatic.     Right Ear: Tympanic membrane, ear canal and external ear normal.     Left Ear: Tympanic membrane, ear canal and external ear normal.     Mouth/Throat:     Mouth: Mucous membranes are moist.     Pharynx: Oropharynx is clear.  Eyes:     Extraocular Movements: Extraocular movements intact.     Conjunctiva/sclera: Conjunctivae normal.     Pupils: Pupils are equal, round,  and reactive to light.  Neck:     Thyroid: No thyromegaly.     Trachea: No tracheal deviation.  Cardiovascular:     Rate and Rhythm: Normal rate and regular rhythm.     Pulses:          Dorsalis pedis pulses are 2+ on the right side and 2+ on the left side.     Heart sounds: No murmur heard. Pulmonary:     Effort: Pulmonary effort is normal. No respiratory distress.     Breath sounds: Normal breath sounds.  Chest:  Breasts:    Right: No supraclavicular adenopathy.     Left: No supraclavicular adenopathy.  Abdominal:     Palpations: Abdomen is soft. There is no hepatomegaly or mass.     Tenderness: There is no abdominal tenderness.  Genitourinary:    Comments: Deferred to urologist. Musculoskeletal:        General: No tenderness.     Cervical back: Normal range of motion.     Comments: No signs of synovitis.  Lymphadenopathy:     Cervical: No cervical adenopathy.     Upper Body:     Right upper body: No supraclavicular adenopathy.     Left upper body: No supraclavicular adenopathy.  Skin:    General: Skin is warm.     Findings: No erythema.  Neurological:     General: No focal deficit present.     Mental Status: He is alert and oriented to person, place, and time.     Cranial Nerves: No cranial nerve deficit.     Sensory: No sensory deficit.     Coordination: Coordination  normal.     Gait: Gait normal.     Deep Tendon Reflexes:     Reflex Scores:      Bicep reflexes are 2+ on the right side and 2+ on the left side.      Patellar reflexes are 2+ on the right side and 2+ on the left side. Psychiatric:     Comments: Good eye contact and well groomed.   ASSESSMENT AND PLAN:  Mr.Wayne Larsen was seen today for annual exam.  Diagnoses and all orders for this visit:  Routine general medical examination at a health care facility We discussed the importance of regular physical activity and healthy diet for prevention of chronic illness and/or complications. Preventive guidelines reviewed. Vaccination up to date. Form will be completed, he is dropping copy of recent lab results.  Next CPE in a year.  Polyneuropathy associated with underlying disease ([place]) On Lyrica 200 mg bid. Continue appropriate foot care. Continue following with endocrinologist.  Hyperlipidemia associated with type 2 diabetes mellitus ([place]) Continue Atorvastatin 40 mg daily.  Morbid obesity ([place]) He has lost about 16 Lb since his last CPE. He understands the benefits of wt loss as well as adverse effects of obesity. Consistency with healthy diet and physical activity encouraged.  History of colon polyps -     Ambulatory referral to Gastroenterology  OSA on CPAP He is not sure which provider is in his network, he will call his health insurance and let me know.  Type 2 diabetes mellitus with other specified complication, without long-term current use of insulin (HCC) Continue following with Dr. Garnet Koyanagi.  Benign essential hypertension BP adequately controlled. No changes in current management.   Return in about 1 year (around 04/16/2022) for CPE.   Wayne Larsen G. Martinique, MD  [facility]. [place] office.

## 2021-04-17 ENCOUNTER — Telehealth: Payer: Self-pay | Admitting: Family Medicine

## 2021-04-17 ENCOUNTER — Encounter: Payer: Self-pay | Admitting: Family Medicine

## 2021-04-17 NOTE — Telephone Encounter (Signed)
Form filled out & on pcp's desk to be signed.  

## 2021-04-17 NOTE — Telephone Encounter (Signed)
Pt dropped off lab results that Dr. Martinique requested so it could be signed. Results will be in the folder   Patient could be reached at [phone] when results are complete.  Please Advise.

## 2021-04-18 NOTE — Telephone Encounter (Signed)
I left patient a voicemail letting him know that his form is completed and up front for pick up along with his copy of the lab results.

## 2021-04-19 ENCOUNTER — Encounter: Payer: Self-pay | Admitting: Family Medicine

## 2021-05-26 ENCOUNTER — Other Ambulatory Visit: Payer: Self-pay | Admitting: Family Medicine

## 2021-05-26 DIAGNOSIS — I10 Essential (primary) hypertension: Secondary | ICD-10-CM

## 2021-06-04 ENCOUNTER — Telehealth: Payer: Self-pay | Admitting: Family Medicine

## 2021-06-04 DIAGNOSIS — G4733 Obstructive sleep apnea (adult) (pediatric): Secondary | ICD-10-CM

## 2021-06-04 NOTE — Telephone Encounter (Signed)
I placed a new referral - can send to   [facility] in [place], [place] Address: [address], [place], [place] [address] Hours:  Closes soon ? 5PM ? [place] Phone: [phone]

## 2021-06-04 NOTE — Telephone Encounter (Signed)
.  TELEPHONE ENCOUNTER PRIMARY CARE PROVIDER: Martinique, Betty G, MD  REASON FOR CALL:The patient is not satisfied with [facility] want Dr Martinique to recommend another  pulmonologistDoctor LAST VISIT IN THIS CLINIC:  05/26/2021 NEXT APPOINTMENT SCHEDULED IN THIS CLINIC: Visit date not found

## 2021-07-31 LAB — HM DIABETES EYE EXAM

## 2021-08-08 ENCOUNTER — Telehealth: Payer: Self-pay | Admitting: Internal Medicine

## 2021-08-08 ENCOUNTER — Encounter: Payer: Self-pay | Admitting: Internal Medicine

## 2021-08-08 NOTE — Telephone Encounter (Signed)
Hey Dr. Lorenso Courier,   We received a referral for colonoscopy. Patient last procedure was in 2018. Records are in Epic. Could you please review and advise on scheduling?  Thank you.

## 2021-08-10 NOTE — Telephone Encounter (Signed)
Hi Dr. Lorenso Courier,   I am not 100% sure but it says 10 years under Health Maintenance . Not sure if that is accurate.

## 2021-08-19 ENCOUNTER — Encounter: Payer: Self-pay | Admitting: Cardiovascular Disease

## 2021-08-19 NOTE — Progress Notes (Signed)
Cardiology Office Note:    Date:  08/20/2021   ID:  [name], DOB 03/08/1966, MRN [id number]  PCP:  Martinique, Betty G, MD  Cardiologist:  Mertie Moores, MD  Electrophysiologist:  None   Referring MD: Martinique, Betty G, MD   Chief Complaint  Patient presents with   Hypertension       Wayne Larsen is a 55 y.o. male with a hx of hypertension, hyperlipidemia.  He recently moved from [place] and is here to establish care.  We were asked to see him for his HTN and hyperlipidemia by Dr. Martinique .   Hx of renal cell cancer - s/p right nephrectomy    Originally from [place] .  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  Has been told that had a MI years ago.  Has not had cath or stenting .   Fairly active now.   Luz [place] quite a bit .  [place] .   Trying to start an exercise program .  Works 16 hour days .   No CP , works in yard without any issues.  Eats quite a bit out on the road.  And when he is at home he cooks foods that are typically preprepared.  November 03, 2019:  BP is much better ,  Edema is much better after reducing the amlodipine.  Is on  We added HCTZ and kdur  BP is much better  Wt is 335 lbs.  ( down 7 lbs from previous visit in Nov. 2021)   He is feeling much better.  He admits that he still eats too much.  Nov. 30, 2021: Seen today for follow up of HTN and HLD  Wt. Is 337 lbs  ( up 2 lbs from previous )   Lipid profile from March 22, 2020 looks good.  His total cholesterol is 109.  HDL is 39.  LDL is 45.  Triglyceride levels 123.  Does not work for [employer] any longer , had to travel too much , is currently between jobs.   No leg swelling  No chest pain  Is exercising regularly    Nov. 28, 2022 Wayne Larsen is seen today for follow up of his HTN and  HLD  Wt is 316 ( down 21 lbs from previous exam)  Has limited his diet. Eating much better.  Has been having some chest pain Especially in the evening Has been walking / jogging almost daily  Has some upper abdominal / lower chest pain with running  Also has some CP in the evenings     Past Medical History:  Diagnosis Date   Allergy    seasonal allergies   Blood in stool    history of blood in stool   Cancer ([place]) 2008   kidney   Chronic kidney disease    Depression    Diabetes mellitus without complication ([place])    Heart murmur    History of colon polyps    Hypertension     Past Surgical History:  Procedure Laterality Date   BACK SURGERY  1989 and [place] Left 2012   nephrectomy Right    NOSE SURGERY  2015    Current Medications: Current Meds  Medication Sig  amLODipine (NORVASC) 5 MG tablet TAKE 1 TABLET BY MOUTH EVERY DAY   atorvastatin (LIPITOR) 40 MG tablet TAKE 1 TABLET BY MOUTH EVERYDAY AT BEDTIME   FARXIGA 10 MG TABS tablet Take 10 mg by mouth daily.   hydrochlorothiazide (HYDRODIURIL) 25 MG tablet TAKE 1 TABLET BY MOUTH EVERY DAY   lisinopril (ZESTRIL) 20 MG tablet TAKE 1 TABLET BY MOUTH EVERY DAY   metFORMIN (GLUCOPHAGE) 850 MG tablet Take 1,700 mg by mouth daily.    metoprolol tartrate (LOPRESSOR) 100 MG tablet Take one tablet by mouth 2 hours prior to CT Scan   OZEMPIC, 1 MG/DOSE, 4 MG/3ML SOPN Inject 1 mg into the skin once a week.   potassium chloride (KLOR-CON) 10 MEQ tablet TAKE 1 TABLET BY MOUTH EVERY DAY   pregabalin (LYRICA) 200 MG capsule Take 200 mg by mouth daily.   zolpidem (AMBIEN) 10 MG tablet Take 1 tablet (10 mg total) by mouth at bedtime as needed for sleep.     Allergies:   Patient has no known allergies.   Social History   Socioeconomic History   Marital status: Married    Spouse name: Not on file   Number of children: 2   Years of education: Not on file   Highest education level:  Not on file  Occupational History   Not on file  Tobacco Use   Smoking status: Never   Smokeless tobacco: Never  Vaping Use   Vaping Use: Never used  Substance and Sexual Activity   Alcohol use: Yes   Drug use: Never   Sexual activity: Yes  Other Topics Concern   Not on file  Social History Narrative   Not on file   Social Determinants of Health   Financial Resource Strain: Not on file  Food Insecurity: Not on file  Transportation Needs: Not on file  Physical Activity: Not on file  Stress: Not on file  Social Connections: Not on file     Family History: The patient's family history includes Alcohol abuse in his father; Arthritis in his father; Cancer in his mother; Early death in his mother; Heart disease in his father; Hyperlipidemia in his father; Hypertension in his father.  ROS:   Please see the history of present illness.     All other systems reviewed and are negative.  EKGs/Labs/Other Studies Reviewed:    The following studies were reviewed today:   EKG:     August 20, 2021: Normal sinus rhythm at 75.  Right bundle branch block.  Right bundle blanch block with associated ST and T wave changes is new from his previous EKG a year ago.   Recent Labs: 02/27/2021: ALT 40; BUN 17; Creatinine 1.3; Potassium 4.4; Sodium 143  Recent Lipid Panel    Component Value Date/Time   CHOL 119 02/27/2021 0000   TRIG 154 02/27/2021 0000   HDL 37 02/27/2021 0000   CHOLHDL 3 03/22/2020 0920   VLDL 24.6 03/22/2020 0920   LDLCALC 51 02/27/2021 0000    Physical Exam:    Physical Exam: Blood pressure 138/88, pulse 75, height 6\' 3"  (1.905 m), weight (!) 316 lb (143.3 kg), SpO2 98 %.  GEN:  moderately obese , very tall man , NAD  HEENT: Normal NECK: No JVD; No carotid bruits LYMPHATICS: No lymphadenopathy CARDIAC: RRR , no murmurs, rubs, gallops RESPIRATORY:  Clear to auscultation without rales, wheezing or rhonchi  ABDOMEN: Soft, non-tender,  non-distended MUSCULOSKELETAL:  No edema; No deformity  SKIN: Warm and dry NEUROLOGIC:  Alert and oriented x 3      ASSESSMENT:    1. Precordial pain   2. Chest pain of uncertain etiology   3. RBBB   4. Hyperlipidemia associated with type 2 diabetes mellitus ([place])     PLAN:     1.   Exertional chest pain.  Jandel presents complaining of some exertional chest pain.  He started exercising several months ago and has noted some chest discomfort when he walks or jogs.  This is associated with a right bundle branch block with new ST and T wave changes.  For further assessment I would like to do a coronary CT angiogram.  I would also like to do an echocardiogram to assess his LV function and for further evaluation of his right bundle branch block.  1.  Essential hypertension:   Blood pressure seems to be well controlled.    2.  Hyperlipidemia: His lipids look good.  His last LDL was 51.     Medication Adjustments/Labs and Tests Ordered: Current medicines are reviewed at length with the patient today.  Concerns regarding medicines are outlined above.  Orders Placed This Encounter  Procedures   CT CORONARY MORPH W/CTA COR W/SCORE W/CA W/CM &/OR WO/CM   Basic Metabolic Panel (BMET)   EKG 12-Lead   ECHOCARDIOGRAM COMPLETE    Meds ordered this encounter  Medications   metoprolol tartrate (LOPRESSOR) 100 MG tablet    Sig: Take one tablet by mouth 2 hours prior to CT Scan    Dispense:  1 tablet    Refill:  0        Patient Instructions  Medication Instructions:  Your physician recommends that you continue on your current medications as directed. Please refer to the Current Medication list given to you today.  *If you need a refill on your cardiac medications before your next appointment, please call your pharmacy*   Lab Work: Lab work to be done today--BMP If you have labs (blood work) drawn today and your tests are completely normal, you will receive your results only  by: [place] (if you have MyChart) OR A paper copy in the mail If you have any lab test that is abnormal or we need to change your treatment, we will call you to review the results.   Testing/Procedures: Your physician has requested that you have an echocardiogram. Echocardiography is a painless test that uses sound waves to create images of your heart. It provides your doctor with information about the size and shape of your heart and how well your heart's chambers and valves are working. This procedure takes approximately one hour. There are no restrictions for this procedure.  Your physician has requested that you have cardiac CT. Cardiac computed tomography (CT) is a painless test that uses an x-ray machine to take clear, detailed pictures of your heart. For further information please visit [email or web address].[email or web address]. Please follow instruction sheet as given.     Follow-Up: At [facility], you and your health needs are our priority.  As part of our continuing mission to provide you with exceptional heart care, we have created designated Provider Care Teams.  These Care Teams include your primary Cardiologist (physician) and Advanced Practice Providers (APPs -  Physician Assistants and Nurse Practitioners) who all work together to provide you with the care you need, when you need it.  We recommend signing up for the patient portal called "MyChart".  Sign up information is provided on this After Visit Summary.  MyChart is used  to connect with patients for Virtual Visits (Telemedicine).  Patients are able to view lab/test results, encounter notes, upcoming appointments, etc.  Non-urgent messages can be sent to your provider as well.   To learn more about what you can do with MyChart, go to [email or web address].    Your next appointment:   3 month(s)  The format for your next appointment:   In Person  Provider:   Mertie Moores, MD     Other Instructions     Your cardiac CT  will be scheduled at one of the below locations:   [facility] [address] [place], [place] [address] [phone]  [facility] [address] [place],  [address] [phone]  If scheduled at [facility], please arrive at the [facility] main entrance (entrance A) of [facility] 30 minutes prior to test start time. You can use the FREE valet parking offered at the main entrance (encouraged to control the heart rate for the test) Proceed to the [facility] Radiology Department (first floor) to check-in and test prep.  If scheduled at [facility], please arrive 15 mins early for check-in and test prep.  Please follow these instructions carefully (unless otherwise directed):  Hold all erectile dysfunction medications at least 3 days (72 hrs) prior to test.  On the Night Before the Test: Be sure to Drink plenty of water. Do not consume any caffeinated/decaffeinated beverages or chocolate 12 hours prior to your test. Do not take any antihistamines 12 hours prior to your test.  On the Day of the Test: Drink plenty of water until 1 hour prior to the test. Do not eat any food 4 hours prior to the test. You may take your regular medications prior to the test.  Take metoprolol (Lopressor) two hours prior to test. HOLD Furosemide/Hydrochlorothiazide morning of the test.          After the Test: Drink plenty of water. After receiving IV contrast, you may experience a mild flushed feeling. This is normal. On occasion, you may experience a mild rash up to 24 hours after the test. This is not dangerous. If this occurs, you can take Benadryl 25 mg and increase your fluid intake. If you experience trouble breathing, this can be serious. If it is severe call 911 IMMEDIATELY. If it is mild, please call our office. If you take any of these medications: Glipizide/Metformin,  Avandament, Glucavance, please do not take 48 hours after completing test unless otherwise instructed.  Please allow 2-4 weeks for scheduling of routine cardiac CTs. Some insurance companies require a pre-authorization which may delay scheduling of this test.   For non-scheduling related questions, please contact the cardiac imaging nurse navigator should you have any questions/concerns: Marchia Bond, Cardiac Imaging Nurse Navigator Gordy Clement, Cardiac Imaging Nurse Navigator [facility] Direct Office Dial: [phone]   For scheduling needs, including cancellations and rescheduling, please call [place], [phone].    Signed, Mertie Moores, MD  08/20/2021 2:07 PM    [place]

## 2021-08-20 ENCOUNTER — Ambulatory Visit: Payer: 59 | Admitting: Cardiovascular Disease

## 2021-08-20 ENCOUNTER — Other Ambulatory Visit: Payer: Self-pay

## 2021-08-20 ENCOUNTER — Encounter: Payer: Self-pay | Admitting: Cardiovascular Disease

## 2021-08-20 VITALS — BP 138/88 | HR 75 | Ht 75.0 in | Wt 316.0 lb

## 2021-08-20 DIAGNOSIS — E1169 Type 2 diabetes mellitus with other specified complication: Secondary | ICD-10-CM

## 2021-08-20 DIAGNOSIS — I4510 Unspecified right bundle-branch block: Secondary | ICD-10-CM | POA: Diagnosis not present

## 2021-08-20 DIAGNOSIS — R072 Precordial pain: Secondary | ICD-10-CM | POA: Diagnosis not present

## 2021-08-20 DIAGNOSIS — E785 Hyperlipidemia, unspecified: Secondary | ICD-10-CM

## 2021-08-20 DIAGNOSIS — R079 Chest pain, unspecified: Secondary | ICD-10-CM | POA: Diagnosis not present

## 2021-08-20 LAB — BASIC METABOLIC PANEL
BUN/Creatinine Ratio: 17 (ref 9–20)
BUN: 21 mg/dL (ref 6–24)
CO2: 24 mmol/L (ref 20–29)
Calcium: 10.1 mg/dL (ref 8.7–10.2)
Chloride: 100 mmol/L (ref 96–106)
Creatinine, Ser: 1.25 mg/dL (ref 0.76–1.27)
Glucose: 90 mg/dL (ref 70–99)
Potassium: 4.3 mmol/L (ref 3.5–5.2)
Sodium: 139 mmol/L (ref 134–144)
eGFR: 67 mL/min/{1.73_m2} (ref >59)

## 2021-08-20 MED ORDER — METOPROLOL TARTRATE 100 MG PO TABS: ORAL_TABLET | ORAL | 0 refills | Status: DC

## 2021-08-20 NOTE — Patient Instructions (Signed)
Medication Instructions:  Your physician recommends that you continue on your current medications as directed. Please refer to the Current Medication list given to you today.  *If you need a refill on your cardiac medications before your next appointment, please call your pharmacy*   Lab Work: Lab work to be done today--BMP If you have labs (blood work) drawn today and your tests are completely normal, you will receive your results only by: [place] (if you have MyChart) OR A paper copy in the mail If you have any lab test that is abnormal or we need to change your treatment, we will call you to review the results.   Testing/Procedures: Your physician has requested that you have an echocardiogram. Echocardiography is a painless test that uses sound waves to create images of your heart. It provides your doctor with information about the size and shape of your heart and how well your heart's chambers and valves are working. This procedure takes approximately one hour. There are no restrictions for this procedure.  Your physician has requested that you have cardiac CT. Cardiac computed tomography (CT) is a painless test that uses an x-ray machine to take clear, detailed pictures of your heart. For further information please visit [email or web address]. Please follow instruction sheet as given.     Follow-Up: At [facility], you and your health needs are our priority.  As part of our continuing mission to provide you with exceptional heart care, we have created designated Provider Care Teams.  These Care Teams include your primary Cardiologist (physician) and Advanced Practice Providers (APPs -  Physician Assistants and Nurse Practitioners) who all work together to provide you with the care you need, when you need it.  We recommend signing up for the patient portal called "MyChart".  Sign up information is provided on this After Visit Summary.  MyChart is used to connect with patients  for Virtual Visits (Telemedicine).  Patients are able to view lab/test results, encounter notes, upcoming appointments, etc.  Non-urgent messages can be sent to your provider as well.   To learn more about what you can do with MyChart, go to [email or web address].    Your next appointment:   3 month(s)  The format for your next appointment:   In Person  Provider:   Mertie Moores, MD     Other Instructions     Your cardiac CT will be scheduled at one of the below locations:   [facility] [address] [place], [place] [address] [phone]  [facility] [address] [place], [place] [address] [phone]  If scheduled at [facility], please arrive at the [facility] main entrance (entrance A) of [facility] 30 minutes prior to test start time. You can use the FREE valet parking offered at the main entrance (encouraged to control the heart rate for the test) Proceed to the [facility] Radiology Department (first floor) to check-in and test prep.  If scheduled at [facility], please arrive 15 mins early for check-in and test prep.  Please follow these instructions carefully (unless otherwise directed):  Hold all erectile dysfunction medications at least 3 days (72 hrs) prior to test.  On the Night Before the Test: Be sure to Drink plenty of water. Do not consume any caffeinated/decaffeinated beverages or chocolate 12 hours prior to your test. Do not take any antihistamines 12 hours prior to your test.  On the Day of the Test:  Drink plenty of water until 1 hour prior to the test. Do not eat any food 4 hours prior to the test. You may take your regular medications prior to the test.  Take metoprolol (Lopressor) two hours prior to test. HOLD Furosemide/Hydrochlorothiazide morning of the test.          After the Test: Drink plenty of water. After receiving IV  contrast, you may experience a mild flushed feeling. This is normal. On occasion, you may experience a mild rash up to 24 hours after the test. This is not dangerous. If this occurs, you can take Benadryl 25 mg and increase your fluid intake. If you experience trouble breathing, this can be serious. If it is severe call 911 IMMEDIATELY. If it is mild, please call our office. If you take any of these medications: Glipizide/Metformin, Avandament, Glucavance, please do not take 48 hours after completing test unless otherwise instructed.  Please allow 2-4 weeks for scheduling of routine cardiac CTs. Some insurance companies require a pre-authorization which may delay scheduling of this test.   For non-scheduling related questions, please contact the cardiac imaging nurse navigator should you have any questions/concerns: Marchia Bond, Cardiac Imaging Nurse Navigator Gordy Clement, Cardiac Imaging Nurse Navigator [facility] Direct Office Dial: [phone]   For scheduling needs, including cancellations and rescheduling, please call [place], [phone].

## 2021-08-20 NOTE — Telephone Encounter (Signed)
Spoke with patient about recommendation of colonoscopy in 08/2022. Patient expressed understanding and will call if have any problems. Recall in system.

## 2021-09-06 ENCOUNTER — Telehealth (HOSPITAL_COMMUNITY): Payer: Self-pay | Admitting: Emergency Medicine

## 2021-09-06 NOTE — Telephone Encounter (Signed)
Reaching out to patient to offer assistance regarding upcoming cardiac imaging study; pt verbalizes understanding of appt date/time, parking situation and where to check in, pre-test NPO status and medications ordered, and verified current allergies; name and call back number provided for further questions should they arise Wayne Bond RN Navigator Cardiac Imaging [facility] [phone] office [phone] cell  Hold diuretic 100mg  metop Denies iv issues Arrival 1130 (coming from ch st office from echo appt)

## 2021-09-07 ENCOUNTER — Ambulatory Visit (HOSPITAL_BASED_OUTPATIENT_CLINIC_OR_DEPARTMENT_OTHER): Payer: 59

## 2021-09-07 ENCOUNTER — Ambulatory Visit (HOSPITAL_COMMUNITY)
Admission: RE | Admit: 2021-09-07 | Discharge: 2021-09-07 | Disposition: A | Discharge status: Home / Self Care | Payer: 59 | Source: Ambulatory Visit | Attending: Cardiovascular Disease | Admitting: Cardiovascular Disease

## 2021-09-07 ENCOUNTER — Other Ambulatory Visit: Payer: Self-pay

## 2021-09-07 DIAGNOSIS — R079 Chest pain, unspecified: Secondary | ICD-10-CM | POA: Insufficient documentation

## 2021-09-07 DIAGNOSIS — R072 Precordial pain: Secondary | ICD-10-CM

## 2021-09-07 DIAGNOSIS — I4510 Unspecified right bundle-branch block: Secondary | ICD-10-CM

## 2021-09-07 LAB — ECHOCARDIOGRAM COMPLETE
Area-P 1/2: 2.83 cm2
S' Lateral: 3.4 cm

## 2021-09-07 MED ORDER — NITROGLYCERIN 0.4 MG SL SUBL
SUBLINGUAL_TABLET | SUBLINGUAL | Status: AC
  Filled 2021-09-07: qty 2

## 2021-09-07 MED ORDER — IOHEXOL 350 MG/ML SOLN
100.0000 mL | Freq: Once | INTRAVENOUS | Status: AC | PRN
  Administered 2021-09-07: 100 mL via INTRAVENOUS

## 2021-09-07 MED ORDER — NITROGLYCERIN 0.4 MG SL SUBL
0.8000 mg | SUBLINGUAL_TABLET | Freq: Once | SUBLINGUAL | Status: AC
  Administered 2021-09-07: 0.8 mg via SUBLINGUAL

## 2021-09-09 ENCOUNTER — Other Ambulatory Visit: Payer: Self-pay | Admitting: Cardiovascular Disease

## 2021-09-09 DIAGNOSIS — E1169 Type 2 diabetes mellitus with other specified complication: Secondary | ICD-10-CM

## 2021-09-13 ENCOUNTER — Telehealth: Payer: Self-pay

## 2021-09-13 NOTE — Telephone Encounter (Signed)
Attempted phone call to pt and left voicemail message that results have been released to pt's MyChart.  Please call [phone] for further questions or concerns.

## 2021-09-19 ENCOUNTER — Encounter: Payer: Self-pay | Admitting: *Deleted

## 2021-11-12 ENCOUNTER — Encounter: Payer: Self-pay | Admitting: Cardiovascular Disease

## 2021-11-12 NOTE — Progress Notes (Signed)
This encounter was created in error - please disregard.

## 2021-11-13 ENCOUNTER — Encounter: Payer: 59 | Admitting: Cardiovascular Disease

## 2021-11-13 NOTE — Progress Notes (Signed)
ACUTE VISIT Chief Complaint  Patient presents with   Leg Pain    Left leg, started about 4 weeks ago. Has ben jogging for at least 6 months, unsure if he may have pulled a muscle. Has tried resting it, but it hasn't gotten any better.   HPI: Wayne Larsen is a 56 y.o. male, who is here today complaining of LLE pain as described above. He does not recall any specific injury, pain started "out of nowhere", sudden onset. Pain is in the middle of left buttock. He has not noted edema,erythema,or deformity.  Leg Pain  The incident occurred more than 1 week ago. There was no injury mechanism. The pain is present in the left thigh. The pain is at a severity of 10/10. The pain is severe. The pain has been Constant since onset. Pertinent negatives include no loss of motion or numbness. The symptoms are aggravated by movement. He has tried rest, ice and heat for the symptoms. The treatment provided moderate relief.  Pain is affecting sleep. Local ice and heat. Exacerbated by fast walk.Achy constant and with intermittent sharp with movement. He stopped exercising jogging for 2 weeks and pain greatly improved.  He really would like to continue jogging because it has helped with wt loss. His goal is to get down tp 217 Lb. He is eating healthier . He is on Ozempic and Metformin. Glucose better controlled.  He was last seen on 04/16/21, since then he has followed with his endocrinologist. He has also seen ENT and cardiologist. According to pt, colonoscopy was not deemed necessary this year, scheduled for next year. He has not noted changes in bowel habits,blood in stool,or melena.  HTN on Lisinopril 20 mg daily,HCTZ 25 mg daily, and Amlodipine 5 mg daily.  Lab Results  Component Value Date   CREATININE 1.25 08/20/2021   BUN 21 08/20/2021   NA 139 08/20/2021   K 4.3 08/20/2021   CL 100 08/20/2021   CO2 24 08/20/2021   Review of Systems  Constitutional:  Positive for fatigue.  Negative for activity change, appetite change and fever.  Respiratory:  Negative for cough, shortness of breath and wheezing.   Cardiovascular:  Negative for chest pain, palpitations and leg swelling.  Gastrointestinal:  Negative for abdominal pain, nausea and vomiting.  Genitourinary:  Negative for decreased urine volume, dysuria and hematuria.  Neurological:  Negative for weakness, numbness and headaches.  Psychiatric/Behavioral:  Positive for sleep disturbance.   Rest see pertinent positives and negatives per HPI.  Current Outpatient Medications on File Prior to Visit  Medication Sig Dispense Refill   amLODipine (NORVASC) 5 MG tablet TAKE 1 TABLET BY MOUTH EVERY DAY 90 tablet 3   atorvastatin (LIPITOR) 40 MG tablet TAKE 1 TABLET BY MOUTH EVERYDAY AT BEDTIME 90 tablet 3   FARXIGA 10 MG TABS tablet Take 10 mg by mouth daily.     hydrochlorothiazide (HYDRODIURIL) 25 MG tablet TAKE 1 TABLET BY MOUTH EVERY DAY 90 tablet 3   lisinopril (ZESTRIL) 20 MG tablet TAKE 1 TABLET BY MOUTH EVERY DAY 90 tablet 3   metFORMIN (GLUCOPHAGE) 850 MG tablet Take 1,700 mg by mouth daily.      OZEMPIC, 1 MG/DOSE, 4 MG/3ML SOPN Inject 1 mg into the skin once a week.     potassium chloride (KLOR-CON) 10 MEQ tablet TAKE 1 TABLET BY MOUTH EVERY DAY 90 tablet 3   pregabalin (LYRICA) 200 MG capsule Take 200 mg by mouth daily.     zolpidem (  AMBIEN) 10 MG tablet Take 1 tablet (10 mg total) by mouth at bedtime as needed for sleep. 30 tablet 2   No current facility-administered medications on file prior to visit.     Past Medical History:  Diagnosis Date   Allergy    seasonal allergies   Blood in stool    history of blood in stool   Cancer ([place]) 2008   kidney   Chronic kidney disease    Depression    Diabetes mellitus without complication (HCC)    Heart murmur    History of colon polyps    Hypertension    No Known Allergies  Social History   Socioeconomic History   Marital status: Married    Spouse  name: Not on file   Number of children: 2   Years of education: Not on file   Highest education level: Not on file  Occupational History   Not on file  Tobacco Use   Smoking status: Never   Smokeless tobacco: Never  Vaping Use   Vaping Use: Never used  Substance and Sexual Activity   Alcohol use: Yes   Drug use: Never   Sexual activity: Yes  Other Topics Concern   Not on file  Social History Narrative   Not on file   Social Determinants of Health   Financial Resource Strain: Not on file  Food Insecurity: Not on file  Transportation Needs: Not on file  Physical Activity: Not on file  Stress: Not on file  Social Connections: Not on file    Vitals:   11/14/21 1017  BP: 128/80  Pulse: 82  Resp: 16  SpO2: 98%   Wt Readings from Last 3 Encounters:  11/14/21 (!) 317 lb 6 oz (144 kg)  08/20/21 (!) 316 lb (143.3 kg)  04/16/21 (!) 319 lb 8 oz (144.9 kg)   Body mass index is 39.67 kg/m.  Physical Exam Vitals and nursing note reviewed.  Constitutional:      General: He is not in acute distress.    Appearance: He is well-developed. He is not ill-appearing.  HENT:     Head: Normocephalic and atraumatic.  Eyes:     Conjunctiva/sclera: Conjunctivae normal.  Cardiovascular:     Rate and Rhythm: Normal rate and regular rhythm.     Pulses:          Dorsalis pedis pulses are 2+ on the left side.       Posterior tibial pulses are 2+ on the left side.     Heart sounds: No murmur heard. Pulmonary:     Effort: Pulmonary effort is normal. No respiratory distress.  Musculoskeletal:       Legs:     Comments: Pain elicited with palpation right under ischial tuberosity.  Skin:    General: Skin is warm.     Findings: No erythema or rash.  Neurological:     General: No focal deficit present.     Mental Status: He is alert and oriented to person, place, and time.     Comments: Antalgic gait, not assisted.   ASSESSMENT AND PLAN:  Wayne Larsen was seen today for leg  pain.  Diagnoses and all orders for this visit:  Left hamstring injury, initial encounter He really would like to continue jogging, which has helped with wt loss. I do not think imaging is needed today, may benefit from [place] to evaluate for possible muscle tear. Problem has been going on for 4 weeks, so it is appropriate to obtain  sport medicine consultation. For now recommend avoiding jogging, which aggravates pain. Toradol recommended for pain management, mainly at night, some side effects discussed. Salonpas or icy hot may help. Tylenol 500 mg 3-4 times per day.  -     Ambulatory referral to Sports Medicine -     traMADol (ULTRAM) 50 MG tablet; Take 1 tablet (50 mg total) by mouth every 12 (twelve) hours as needed for up to 7 days.  Morbid obesity ([place]) Co-morbilities: OSA,HLD,DM II. He has noted wt loss. He understands the benefits of wt loss as well as adverse effects of obesity. Consistency with continuing a healthful diet encouraged. He may want to consider a different type of exercise for now, low impact.  Benign essential hypertension BP adequately controlled. Continue Lisinopril 20 mg daily,HCTZ 25 mg daily, and Amlodipine 5 mg daily.  Return if symptoms worsen or fail to improve.  Osmany Azer G. [place], MD  [facility]. [place] office.

## 2021-11-14 ENCOUNTER — Encounter: Payer: Self-pay | Admitting: Family Medicine

## 2021-11-14 ENCOUNTER — Ambulatory Visit: Payer: 59 | Admitting: Family Medicine

## 2021-11-14 VITALS — BP 128/80 | HR 82 | Resp 16 | Ht 75.0 in | Wt 317.4 lb

## 2021-11-14 DIAGNOSIS — S76302A Unspecified injury of muscle, fascia and tendon of the posterior muscle group at thigh level, left thigh, initial encounter: Secondary | ICD-10-CM | POA: Diagnosis not present

## 2021-11-14 DIAGNOSIS — I10 Essential (primary) hypertension: Secondary | ICD-10-CM | POA: Diagnosis not present

## 2021-11-14 DIAGNOSIS — T148XXA Other injury of unspecified body region, initial encounter: Secondary | ICD-10-CM

## 2021-11-14 MED ORDER — TRAMADOL HCL 50 MG PO TABS: 50.0000 mg | ORAL_TABLET | Freq: Two times a day (BID) | ORAL | 0 refills | Status: AC | PRN

## 2021-11-14 NOTE — Patient Instructions (Signed)
A few things to remember from today's visit:  Muscle strain - Plan: Ambulatory referral to Sports Medicine, traMADol (ULTRAM) 50 MG tablet  If you need refills please call your pharmacy. Do not use My Chart to request refills or for acute issues that need immediate attention.   Avoid activities that cause pain for now. Tramadol at bedtime. Icy hot,salonpas on area may help.  You may want to try elliptical or stationary bike.  Please be sure medication list is accurate. If a new problem present, please set up appointment sooner than planned today.

## 2021-11-16 ENCOUNTER — Other Ambulatory Visit: Payer: Self-pay

## 2021-11-16 ENCOUNTER — Ambulatory Visit (INDEPENDENT_AMBULATORY_CARE_PROVIDER_SITE_OTHER): Payer: 59

## 2021-11-16 ENCOUNTER — Ambulatory Visit: Payer: 59 | Admitting: Sports Medicine

## 2021-11-16 VITALS — BP 118/80 | HR 71 | Ht 75.0 in | Wt 317.0 lb

## 2021-11-16 DIAGNOSIS — M25559 Pain in unspecified hip: Secondary | ICD-10-CM

## 2021-11-16 DIAGNOSIS — S76312A Strain of muscle, fascia and tendon of the posterior muscle group at thigh level, left thigh, initial encounter: Secondary | ICD-10-CM | POA: Diagnosis not present

## 2021-11-16 NOTE — Progress Notes (Signed)
Benito Mccreedy D.[place] [facility] Phone: [phone]   Assessment and Plan:     1. Pain in joint involving pelvic region and thigh, unspecified laterality 2. Strain vs tear of left hamstring muscle, initial encounter -Acute, complicated, initial sports medicine visit - Suspect proximal hamstring tear based on HPI, physical exam, x-ray - No signs on physical exam, HPI, or x-ray that would indicate surgical referral including no avulsion fracture, no weakness, no significant deformity, no sign of distal hamstring tear.  Because of this, we will treat conservatively at this time - Recommend complete rest until 11/19/2021 - on 11/19/2021, start HEP for hamstring and continue until reevaluated - Recommend not starting walking or jogging until reevaluated - Use Tylenol 500 to 1000 mg 2-3 times a day for pain relief - Can continue Ultram prescribed by PCP -X-ray obtained in clinic.  My interpretation: No acute fracture or dislocation.  No signs of avulsion fracture along ischial tuberosity  Pertinent previous records reviewed include none   Follow Up: 2 weeks for reevaluation.  If improving, we could advance therapy.  Would use ultrasound at follow-up visit to further evaluate potential tearing and its severity   Subjective:   I, Wayne Larsen, am serving as a Education administrator for Doctor Glennon Mac  Chief Complaint: left ischial tuberosity pain   HPI:   11/19/21 Patient is a 56 year old male complaining of left ischial tuberosity pain. Patient states The incident occurred Tuesday was walking/jogging heard a snap . The pain is present in the left thigh.The pain is severe. The pain has been Constant since onset. Pertinent negatives include no loss of motion or numbness. The symptoms are aggravated by movement. He has tried rest, ice and heat for the symptoms. The treatment provided moderate relief. Pain is affecting sleep. Local  ice and heat. Exacerbated by fast  walk.Achy constant and with intermittent sharp with movement, no numbness or tingling the pain does radiate from under the butt cheek to the middle hamstring  He stopped exercising jogging for 2 weeks and pain greatly improved. Hx of rugby player   Relevant Historical Information: Hypertension, CAD, OSA, DM type II  Additional pertinent review of systems negative.   Current Outpatient Medications:    amLODipine (NORVASC) 5 MG tablet, TAKE 1 TABLET BY MOUTH EVERY DAY, Disp: 90 tablet, Rfl: 3   atorvastatin (LIPITOR) 40 MG tablet, TAKE 1 TABLET BY MOUTH EVERYDAY AT BEDTIME, Disp: 90 tablet, Rfl: 3   FARXIGA 10 MG TABS tablet, Take 10 mg by mouth daily., Disp: , Rfl:    hydrochlorothiazide (HYDRODIURIL) 25 MG tablet, TAKE 1 TABLET BY MOUTH EVERY DAY, Disp: 90 tablet, Rfl: 3   lisinopril (ZESTRIL) 20 MG tablet, TAKE 1 TABLET BY MOUTH EVERY DAY, Disp: 90 tablet, Rfl: 3   metFORMIN (GLUCOPHAGE) 850 MG tablet, Take 1,700 mg by mouth daily. , Disp: , Rfl:    OZEMPIC, 1 MG/DOSE, 4 MG/3ML SOPN, Inject 1 mg into the skin once a week., Disp: , Rfl:    potassium chloride (KLOR-CON) 10 MEQ tablet, TAKE 1 TABLET BY MOUTH EVERY DAY, Disp: 90 tablet, Rfl: 3   pregabalin (LYRICA) 200 MG capsule, Take 200 mg by mouth daily., Disp: , Rfl:    traMADol (ULTRAM) 50 MG tablet, Take 1 tablet (50 mg total) by mouth every 12 (twelve) hours as needed for up to 7 days., Disp: 14 tablet, Rfl: 0   zolpidem (AMBIEN) 10 MG tablet, Take  1 tablet (10 mg total) by mouth at bedtime as needed for sleep., Disp: 30 tablet, Rfl: 2   Objective:     Vitals:   11/16/21 1028  BP: 118/80  Pulse: 71  SpO2: 96%  Weight: (!) 317 lb (143.8 kg)  Height: 6\' 3"  (1.905 m)      Body mass index is 39.62 kg/m.    Physical Exam:    General: awake, alert, and oriented no acute distress, nontoxic Skin: no suspicious lesions or rashes Neuro:sensation intact distally with no dificits, normal muscle tone,  no atrophy, strength 5/5 in all tested lower ext groups Psych: normal mood and affect, speech clear  Left hip: No deformity, swelling or wasting ROM Fexion 90, ext 20 (painful), IR 45, ER 45 Pain with resisted knee flexion localized to proximal hamstring.  Painful with knee at 0 degrees, 90 degrees, full flexion NTTP over the hip flexors, greater troch, glute musculature, si joint, lumbar spine Negative log roll with FROM Negative FABER Negative FADIR Negative Piriformis test Negative trendelenberg Gait slowed, favoring right leg   Electronically signed by:  Benito Mccreedy D.[facility] 1:40 PM 11/19/21

## 2021-11-16 NOTE — Patient Instructions (Addendum)
Good to see you  Proximal hamstring tear  Recommend complete rest until next Monday 11/19/2021 Starting on 27 th start hamstring HEP  Do HEP for 2 weeks until re- evaluated  Do not recommend walking/ jogging until re-evaluated Tylenol [phone] mg 2-3 times a day for pain relief  Can continue ultram that PCP prescribed  2 week follow up

## 2021-11-25 ENCOUNTER — Encounter: Payer: Self-pay | Admitting: Cardiovascular Disease

## 2021-11-25 NOTE — Progress Notes (Signed)
Cardiology Office Note:    Date:  11/26/2021   ID:  Wayne Larsen, DOB Feb 08, 1966, MRN [id number]  PCP:  Martinique, Betty G, MD  Cardiologist:  Mertie Moores, MD  Electrophysiologist:  None   Referring MD: Martinique, Betty G, MD   Chief Complaint  Patient presents with   Hypertension            Wayne Larsen is a 56 y.o. male with a hx of hypertension, hyperlipidemia.  He recently moved from [place] and is here to establish care.  We were asked to see him for his HTN and hyperlipidemia by Dr. Martinique .   Hx of renal cell cancer - s/p right nephrectomy    Originally from [place] .  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  Has been told that had a MI years ago.  Has not had cath or stenting .   Fairly active now.   Luz [employer] quite a bit .  [place] .   Trying to start an exercise program .  Works 16 hour days .   No CP , works in yard without any issues.  Eats quite a bit out on the road.  And when he is at home he cooks foods that are typically preprepared.  November 03, 2019:  BP is much better ,  Edema is much better after reducing the amlodipine.  Is on  We added HCTZ and kdur  BP is much better  Wt is 335 lbs.  ( down 7 lbs from previous visit in Nov. 2021)   He is feeling much better.  He admits that he still eats too much.  Nov. 30, 2021: Seen today for follow up of HTN and HLD  Wt. Is 337 lbs  ( up 2 lbs from previous )   Lipid profile from March 22, 2020 looks good.  His total cholesterol is 109.  HDL is 39.  LDL is 45.  Triglyceride levels 123.  Does not work for [employer] any longer , had to travel too much , is currently between jobs.   No leg swelling  No chest pain  Is exercising regularly    Nov. 28, 2022 Wayne Larsen is seen today for follow up of his HTN  and HLD  Wt is 316 ( down 21 lbs from previous exam)  Has limited his diet. Eating much better.  Has been having some chest pain Especially in the evening Has been walking / jogging almost daily  Has some upper abdominal / lower chest pain with running  Also has some CP in the evenings   November 26, 2021; Wayne Larsen is seen for follow up of his HTN and HLD Wt is 316  ( former Education administrator )  He had been having some chest pain   Cor CTA showed Coronary calcium score of 73. This was 32 percentile for age and sex matched control. AV - Bicuspid AV   ( echo appears to show a 3 leaflet valve)  RCA : mild prox plaque LM: normal LAD :  mild prox plaque LCx:  non dom, large OM1,  small area of minimal plaque  Echo shows normal LV function,  grade 1 DD, trivial AI Asc. Aorta is borderline dilated  The AV appears to have 3  leaflets on echo   Tore him hamstring while jogging  I recommended  cycling   Lipids were reviewed - LDL is 51    Past Medical History:  Diagnosis Date   Allergy    seasonal allergies   Blood in stool    history of blood in stool   Cancer ([place]) 2008   kidney   Chronic kidney disease    Depression    Diabetes mellitus without complication (HCC)    Heart murmur    History of colon polyps    Hypertension     Past Surgical History:  Procedure Laterality Date   BACK SURGERY  1989 and [place] Left 2012   nephrectomy Right    NOSE SURGERY  2015    Current Medications: Current Meds  Medication Sig   amLODipine (NORVASC) 5 MG tablet TAKE 1 TABLET BY MOUTH EVERY DAY   atorvastatin (LIPITOR) 40 MG tablet TAKE 1 TABLET BY MOUTH EVERYDAY AT BEDTIME   FARXIGA 10 MG TABS tablet Take 10 mg by mouth daily.   hydrochlorothiazide (HYDRODIURIL) 25 MG tablet TAKE 1 TABLET BY MOUTH EVERY DAY   lisinopril (ZESTRIL) 20 MG tablet TAKE 1 TABLET BY MOUTH EVERY DAY   metFORMIN (GLUCOPHAGE) 850 MG tablet Take 1,700 mg by mouth daily.     OZEMPIC, 1 MG/DOSE, 4 MG/3ML SOPN Inject 1 mg into the skin once a week.   potassium chloride (KLOR-CON) 10 MEQ tablet TAKE 1 TABLET BY MOUTH EVERY DAY   pregabalin (LYRICA) 200 MG capsule Take 200 mg by mouth daily.   zolpidem (AMBIEN) 10 MG tablet Take 1 tablet (10 mg total) by mouth at bedtime as needed for sleep.     Allergies:   Patient has no known allergies.   Social History   Socioeconomic History   Marital status: Married    Spouse name: Not on file   Number of children: 2   Years of education: Not on file   Highest education level: Not on file  Occupational History   Not on file  Tobacco Use   Smoking status: Never   Smokeless tobacco: Never  Vaping Use   Vaping Use: Never used  Substance and Sexual Activity   Alcohol use: Yes   Drug use: Never   Sexual activity: Yes  Other Topics Concern   Not on file  Social History Narrative   Not on file   Social Determinants of Health   Financial Resource Strain: Not on file  Food Insecurity: Not on file  Transportation Needs: Not on file  Physical Activity: Not on file  Stress: Not on file  Social Connections: Not on file     Family History: The patient's family history includes Alcohol abuse in his father; Arthritis in his father; Cancer in his mother; Early death in his mother; Heart disease in his father; Hyperlipidemia in his father; Hypertension in his father.  ROS:   Please see the history of present illness.     All other systems reviewed and are negative.  EKGs/Labs/Other Studies Reviewed:    The following studies were reviewed today:   EKG:        Recent Labs: 02/27/2021: ALT 40 08/20/2021: BUN 21; Creatinine, Ser 1.25; Potassium 4.3; Sodium 139  Recent Lipid Panel    Component Value Date/Time   CHOL 119 02/27/2021 0000   TRIG 154 02/27/2021 0000   HDL 37 02/27/2021 0000   CHOLHDL 3 03/22/2020 0920  VLDL 24.6 03/22/2020 0920   LDLCALC 51 02/27/2021 0000    Physical Exam:     Physical  Exam: Blood pressure 126/76, pulse 76, height '6\' 3"'$  (1.905 m), weight (!) 316 lb 12.8 oz (143.7 kg), SpO2 99 %.  GEN:  Well nourished, well developed in no acute distress HEENT: Normal NECK: No JVD; No carotid bruits LYMPHATICS: No lymphadenopathy CARDIAC: RRR , no murmurs, rubs, gallops RESPIRATORY:  Clear to auscultation without rales, wheezing or rhonchi  ABDOMEN: Soft, non-tender, non-distended MUSCULOSKELETAL:  No edema; No deformity  SKIN: Warm and dry NEUROLOGIC:  Alert and oriented x 3     ASSESSMENT:    No diagnosis found.   PLAN:     1.   Exertional chest pain.  No revcurrent cp    1.  Essential hypertension:    BP is fairly well controlled.     2.  Hyperlipidemia:  Cont current meds     Medication Adjustments/Labs and Tests Ordered: Current medicines are reviewed at length with the patient today.  Concerns regarding medicines are outlined above.  No orders of the defined types were placed in this encounter.   No orders of the defined types were placed in this encounter.       Patient Instructions  Medication Instructions:  Your physician recommends that you continue on your current medications as directed. Please refer to the Current Medication list given to you today.  *If you need a refill on your cardiac medications before your next appointment, please call your pharmacy*   Lab Work: NONE If you have labs (blood work) drawn today and your tests are completely normal, you will receive your results only by: [facility] (if you have MyChart) OR A paper copy in the mail If you have any lab test that is abnormal or we need to change your treatment, we will call you to review the results.   Testing/Procedures: NONE   Follow-Up: At [facility], you and your health needs are our priority.  As part of our continuing mission to provide you with exceptional heart care, we have created designated Provider Care Teams.  These Care Teams  include your primary Cardiologist (physician) and Advanced Practice Providers (APPs -  Physician Assistants and Nurse Practitioners) who all work together to provide you with the care you need, when you need it.  Your next appointment:   1 year(s)  The format for your next appointment:   In Person  Provider:   Mertie Moores, MD  or Robbie Lis, PA-C, Christen Bame, NP, or Richardson Dopp, PA-C   Other Instructions [facility] [employer] [place] - [phone]   Signed, Mertie Moores, MD  11/26/2021 9:07 PM    [place]

## 2021-11-26 ENCOUNTER — Other Ambulatory Visit: Payer: Self-pay

## 2021-11-26 ENCOUNTER — Encounter: Payer: Self-pay | Admitting: Cardiovascular Disease

## 2021-11-26 ENCOUNTER — Ambulatory Visit: Payer: 59 | Admitting: Cardiovascular Disease

## 2021-11-26 VITALS — BP 126/76 | HR 76 | Ht 75.0 in | Wt 316.8 lb

## 2021-11-26 DIAGNOSIS — E1169 Type 2 diabetes mellitus with other specified complication: Secondary | ICD-10-CM | POA: Diagnosis not present

## 2021-11-26 DIAGNOSIS — E785 Hyperlipidemia, unspecified: Secondary | ICD-10-CM

## 2021-11-26 DIAGNOSIS — I10 Essential (primary) hypertension: Secondary | ICD-10-CM

## 2021-11-26 NOTE — Patient Instructions (Signed)
Medication Instructions:  ?Your physician recommends that you continue on your current medications as directed. Please refer to the Current Medication list given to you today. ? ?*If you need a refill on your cardiac medications before your next appointment, please call your pharmacy* ? ? ?Lab Work: ?NONE ?If you have labs (blood work) drawn today and your tests are completely normal, you will receive your results only by: ?MyChart Message (if you have MyChart) OR ?A paper copy in the mail ?If you have any lab test that is abnormal or we need to change your treatment, we will call you to review the results. ? ? ?Testing/Procedures: ?NONE ? ? ?Follow-Up: ?At [facility], you and your health needs are our priority.  As part of our continuing mission to provide you with exceptional heart care, we have created designated Provider Care Teams.  These Care Teams include your primary Cardiologist (physician) and Advanced Practice Providers (APPs -  Physician Assistants and Nurse Practitioners) who all work together to provide you with the care you need, when you need it. ? ?Your next appointment:   ?1 year(s) ? ?The format for your next appointment:   ?In Person ? ?Provider:   ?Mertie Moores, MD  or Robbie Lis, PA-C, Christen Bame, NP, or Richardson Dopp, PA-C  ? ?Other Instructions ?[place] - [phone] ?

## 2021-11-29 ENCOUNTER — Ambulatory Visit: Payer: Self-pay

## 2021-11-29 NOTE — Progress Notes (Signed)
? ? Benito Mccreedy D.Merril Abbe ?[facility] ?[place] ?Phone: [phone] ?  ?Assessment and Plan:   ?  ?1. Left hip pain ?2. Strain of left hamstring muscle, subsequent encounter ?3. Pain in joint involving left pelvic region and thigh ?- Acute, complicated, subsequent visit ?- Suspect grade 2 proximal hamstring tendinopathy based on HPI, physical exam, ultrasound ?- Patient has received minimal to no improvement in pain since last office visit despite adequate rest.  With ultrasound findings consistent with at least partial tearing of proximal hamstring, recommend MRI for further evaluation of proximal hamstring tendon and ischial tuberosity ?-Recommend continued rest until reevaluated ?- Continue Tylenol 500 to 1000 mg 2-3 times a day for day-to-day pain relief ? ?Sports Medicine: Musculoskeletal Ultrasound. ?Exam: Limited US of left proximal hamstring ?Diagnosis: Hamstring strain ? ?US Findings: Hypoechoic linear division at hamstring origin likely representing partial hamstring tear.  Disorganized tendon fibers near hamstring origin.  No significant cortical irregularity seen. ? ?US Impression:  ?Grade 2 proximal hamstring tendinopathy, left ? ? ?Pertinent previous records reviewed include none ?  ?Follow Up: After MRI to review results and make treatment plan ?  ?Subjective:   ?I, Wayne Larsen, am serving as a Education administrator for [employer] ? ?Chief Complaint: left ischial tuberosity pain  ? ?HPI:  ?11/19/21 ?Patient is a 56 year old male complaining of left ischial tuberosity pain. Patient states The incident occurred Tuesday was walking/jogging heard a snap . The pain is present in the left thigh.The pain is severe. The pain has been Constant since onset. Pertinent negatives include no loss of motion or numbness. The symptoms are aggravated by movement. He has tried rest, ice and heat for the symptoms. The treatment provided moderate relief. Pain is  affecting sleep. Local ice and heat. Exacerbated by fast  walk.Achy constant and with intermittent sharp with movement, no numbness or tingling the pain does radiate from under the butt cheek to the middle hamstring  He stopped exercising jogging for 2 weeks and pain greatly improved. Hx of rugby player  ? ?11/30/2021 ?Patient states that slight improvement but not what he expected he wanted more of an improvement went for a slight walk and felt a grab isn't able to sit due to comfort level. Sleeping is an issue stopped taking pain med wasn't working made him fuzzy , wanted to talk about an option to smooth out the bone or is the muscle in the way  ?  ?Relevant Historical Information: Hypertension, CAD, OSA, DM type II ? ?Additional pertinent review of systems negative. ? ? ?Current Outpatient Medications:  ?  amLODipine (NORVASC) 5 MG tablet, TAKE 1 TABLET BY MOUTH EVERY DAY, Disp: 90 tablet, Rfl: 3 ?  atorvastatin (LIPITOR) 40 MG tablet, TAKE 1 TABLET BY MOUTH EVERYDAY AT BEDTIME, Disp: 90 tablet, Rfl: 3 ?  FARXIGA 10 MG TABS tablet, Take 10 mg by mouth daily., Disp: , Rfl:  ?  hydrochlorothiazide (HYDRODIURIL) 25 MG tablet, TAKE 1 TABLET BY MOUTH EVERY DAY, Disp: 90 tablet, Rfl: 3 ?  lisinopril (ZESTRIL) 20 MG tablet, TAKE 1 TABLET BY MOUTH EVERY DAY, Disp: 90 tablet, Rfl: 3 ?  metFORMIN (GLUCOPHAGE) 850 MG tablet, Take 1,700 mg by mouth daily. , Disp: , Rfl:  ?  OZEMPIC, 1 MG/DOSE, 4 MG/3ML SOPN, Inject 1 mg into the skin once a week., Disp: , Rfl:  ?  potassium chloride (KLOR-CON) 10 MEQ tablet, TAKE 1 TABLET BY MOUTH EVERY DAY, Disp:  90 tablet, Rfl: 3 ?  pregabalin (LYRICA) 200 MG capsule, Take 200 mg by mouth daily., Disp: , Rfl:  ?  zolpidem (AMBIEN) 10 MG tablet, Take 1 tablet (10 mg total) by mouth at bedtime as needed for sleep., Disp: 30 tablet, Rfl: 2  ? ?Objective:   ?  ?Vitals:  ? 11/30/21 1048  ?BP: 118/80  ?Pulse: 79  ?Resp: (!) 96  ?Weight: (!) 319 lb (144.7 kg)  ?Height: '6\' 3"'$  (1.905 m)  ?  ?   ?Body mass index is 39.87 kg/m?.  ?  ?Physical Exam:   ? ?General: awake, alert, and oriented no acute distress, nontoxic ?Skin: no suspicious lesions or rashes ?Neuro:sensation intact distally with no dificits, normal muscle tone, no atrophy,.  Hip extension and hamstring strength difficult to ascertain due to patient's tenderness, but otherwise strength 5/5 in all tested lower ext groups ?Psych: normal mood and affect, speech clear ?  ?Left hip: ?No deformity, swelling or wasting ?ROM Fexion 90, ext 20 (painful), IR 45, ER 45 ?Pain with resisted knee flexion localized to proximal hamstring.  Painful with knee at 0 degrees, 90 degrees, full flexion ?NTTP over the hip flexors, greater troch, glute musculature, si joint, lumbar spine ?Negative log roll with FROM ?Negative FABER ?Negative FADIR ?Negative Piriformis test ?Negative trendelenberg ?Gait slowed, favoring right leg ? ? ?Electronically signed by:  ?Benito Mccreedy D.Merril Abbe ?[facility] ?12:32 PM 11/30/21 ?

## 2021-11-30 ENCOUNTER — Ambulatory Visit (INDEPENDENT_AMBULATORY_CARE_PROVIDER_SITE_OTHER): Payer: 59 | Admitting: Sports Medicine

## 2021-11-30 ENCOUNTER — Other Ambulatory Visit: Payer: Self-pay

## 2021-11-30 ENCOUNTER — Ambulatory Visit: Payer: Self-pay

## 2021-11-30 VITALS — BP 118/80 | HR 79 | Resp 96 | Ht 75.0 in | Wt 319.0 lb

## 2021-11-30 DIAGNOSIS — S76312D Strain of muscle, fascia and tendon of the posterior muscle group at thigh level, left thigh, subsequent encounter: Secondary | ICD-10-CM | POA: Diagnosis not present

## 2021-11-30 DIAGNOSIS — M25552 Pain in left hip: Secondary | ICD-10-CM | POA: Diagnosis not present

## 2021-11-30 NOTE — Patient Instructions (Addendum)
Good to see you  ?MRI referral  ?Follow up 3 days after your mri to discuss results  ?

## 2022-01-22 ENCOUNTER — Other Ambulatory Visit: Payer: 59

## 2022-01-27 ENCOUNTER — Ambulatory Visit (INDEPENDENT_AMBULATORY_CARE_PROVIDER_SITE_OTHER): Payer: 59

## 2022-01-27 DIAGNOSIS — G8929 Other chronic pain: Secondary | ICD-10-CM

## 2022-01-27 DIAGNOSIS — M25552 Pain in left hip: Secondary | ICD-10-CM | POA: Diagnosis not present

## 2022-01-29 NOTE — Progress Notes (Signed)
Called and left pt a VM for him to call and schedule an appointment  ?

## 2022-01-31 ENCOUNTER — Ambulatory Visit (INDEPENDENT_AMBULATORY_CARE_PROVIDER_SITE_OTHER): Payer: 59 | Admitting: Sports Medicine

## 2022-01-31 VITALS — BP 124/82 | HR 115 | Ht 75.0 in | Wt 319.0 lb

## 2022-01-31 DIAGNOSIS — S76312D Strain of muscle, fascia and tendon of the posterior muscle group at thigh level, left thigh, subsequent encounter: Secondary | ICD-10-CM

## 2022-01-31 DIAGNOSIS — M1612 Unilateral primary osteoarthritis, left hip: Secondary | ICD-10-CM

## 2022-01-31 DIAGNOSIS — S73192A Other sprain of left hip, initial encounter: Secondary | ICD-10-CM

## 2022-01-31 DIAGNOSIS — M25552 Pain in left hip: Secondary | ICD-10-CM | POA: Diagnosis not present

## 2022-01-31 MED ORDER — TRAMADOL HCL 50 MG PO TABS: 50.0000 mg | ORAL_TABLET | Freq: Every evening | ORAL | 0 refills | Status: DC | PRN

## 2022-01-31 MED ORDER — PREDNISONE 10 MG PO TABS: 30.0000 mg | ORAL_TABLET | Freq: Every day | ORAL | 0 refills | Status: AC

## 2022-01-31 NOTE — Progress Notes (Signed)
? ? Wayne Larsen D.Merril Abbe ?[facility] ?[place] ?Phone: [phone] ?  ?Assessment and Plan:   ?  ?1. Left hip pain ?2. Strain of left hamstring muscle, subsequent encounter ?3. Primary osteoarthritis of left hip ?4. Tear of left acetabular labrum, initial encounter ?-Chronic, unchanged, subsequent visit ?- Continued posterior left hip pain localized to proximal hamstring origin. ?- Patient's pain is most consistent with continued pain at partial tear of left hamstring origin based on MRI, ultrasound, physical exam, HPI ?- Reviewed MRI with patient in room and discussed bilateral partial hamstring tearing, more significant on the left side versus right, as well as left hip osteoarthritis and labral fraying ?- Patient cannot use NSAIDs due to having 1 kidney ?- We will refill tramadol 50 mg nightly as needed for pain ?- Start prednisone 30 mg daily x10 days ?- Start physical therapy for hamstring ?- predniSONE (DELTASONE) 10 MG tablet; Take 3 tablets (30 mg total) by mouth daily for 10 days.  Dispense: 30 tablet; Refill: 0 ?- traMADol (ULTRAM) 50 MG tablet; Take 1 tablet (50 mg total) by mouth at bedtime as needed.  Dispense: 15 tablet; Refill: 0  ?  ?Pertinent previous records reviewed include MRI left hip 01/27/2022 ?  ?Follow Up: 3 weeks for reevaluation.  If no improvement or worsening of symptoms, would consider CSI to hamstring tendon versus intra-articular hip based on physical exam ?  ?Subjective:   ?I, Pincus Badder, am serving as a Education administrator for [employer] ?  ?Chief Complaint: left ischial tuberosity pain  ?  ?HPI:  ?11/19/21 ?Patient is a 56 year old male complaining of left ischial tuberosity pain. Patient states The incident occurred Tuesday was walking/jogging heard a snap . The pain is present in the left thigh.The pain is severe. The pain has been Constant since onset. Pertinent negatives include no loss of motion or numbness. The  symptoms are aggravated by movement. He has tried rest, ice and heat for the symptoms. The treatment provided moderate relief. Pain is affecting sleep. Local ice and heat. Exacerbated by fast  walk.Achy constant and with intermittent sharp with movement, no numbness or tingling the pain does radiate from under the butt cheek to the middle hamstring  He stopped exercising jogging for 2 weeks and pain greatly improved. Hx of rugby player  ?  ?11/30/2021 ?Patient states that slight improvement but not what he expected he wanted more of an improvement went for a slight walk and felt a grab isn't able to sit due to comfort level. Sleeping is an issue stopped taking pain med wasn't working made him fuzzy , wanted to talk about an option to smooth out the bone or is the muscle in the way  ? ?01/31/2022 ?Patient states he doing the same  ?  ?Relevant Historical Information: Hypertension, CAD, OSA, DM type II ? ?Additional pertinent review of systems negative. ? ? ?Current Outpatient Medications:  ?  amLODipine (NORVASC) 5 MG tablet, TAKE 1 TABLET BY MOUTH EVERY DAY, Disp: 90 tablet, Rfl: 3 ?  atorvastatin (LIPITOR) 40 MG tablet, TAKE 1 TABLET BY MOUTH EVERYDAY AT BEDTIME, Disp: 90 tablet, Rfl: 3 ?  FARXIGA 10 MG TABS tablet, Take 10 mg by mouth daily., Disp: , Rfl:  ?  hydrochlorothiazide (HYDRODIURIL) 25 MG tablet, TAKE 1 TABLET BY MOUTH EVERY DAY, Disp: 90 tablet, Rfl: 3 ?  lisinopril (ZESTRIL) 20 MG tablet, TAKE 1 TABLET BY MOUTH EVERY DAY, Disp: 90 tablet, Rfl:  3 ?  metFORMIN (GLUCOPHAGE) 850 MG tablet, Take 1,700 mg by mouth daily. , Disp: , Rfl:  ?  OZEMPIC, 1 MG/DOSE, 4 MG/3ML SOPN, Inject 1 mg into the skin once a week., Disp: , Rfl:  ?  potassium chloride (KLOR-CON) 10 MEQ tablet, TAKE 1 TABLET BY MOUTH EVERY DAY, Disp: 90 tablet, Rfl: 3 ?  predniSONE (DELTASONE) 10 MG tablet, Take 3 tablets (30 mg total) by mouth daily for 10 days., Disp: 30 tablet, Rfl: 0 ?  pregabalin (LYRICA) 200 MG capsule, Take 200 mg by mouth  daily., Disp: , Rfl:  ?  traMADol (ULTRAM) 50 MG tablet, Take 1 tablet (50 mg total) by mouth at bedtime as needed., Disp: 15 tablet, Rfl: 0 ?  zolpidem (AMBIEN) 10 MG tablet, Take 1 tablet (10 mg total) by mouth at bedtime as needed for sleep., Disp: 30 tablet, Rfl: 2  ? ?Objective:   ?  ?Vitals:  ? 01/31/22 1029  ?BP: 124/82  ?Pulse: (!) 115  ?SpO2: 93%  ?Weight: (!) 319 lb (144.7 kg)  ?Height: '6\' 3"'$  (1.905 m)  ?  ?  ?Body mass index is 39.87 kg/m?.  ?  ?Physical Exam:   ? ?General: awake, alert, and oriented no acute distress, nontoxic ?Skin: no suspicious lesions or rashes ?Neuro:sensation intact distally with no dificits, normal muscle tone, no atrophy,.  Hip extension and hamstring strength difficult to ascertain due to patient's tenderness, but otherwise strength 5/5 in all tested lower ext groups ?Psych: normal mood and affect, speech clear ?  ?Left hip: ?No deformity, swelling or wasting ?ROM Fexion 90, ext 20 (painful), IR 45, ER 45 ?Pain with resisted knee flexion localized to proximal hamstring.  Painful with knee at 0 degrees, 90 degrees, full flexion ?NTTP over the hip flexors, greater troch, glute musculature, si joint, lumbar spine ?Negative log roll with FROM ?Negative FABER ?Negative FADIR ?Negative Piriformis test ?Negative trendelenberg ?Gait slowed, favoring right leg ? ? ?Electronically signed by:  ?Wayne Larsen D.Merril Abbe ?[facility] ?10:59 AM 01/31/22 ?

## 2022-01-31 NOTE — Patient Instructions (Addendum)
Good to see you ?refill of tramadol  ?Prednisone 30 mg daily for 10 days ?Referral to PT hamstring and hip  ?3 week follow up  ?

## 2022-02-19 ENCOUNTER — Ambulatory Visit: Payer: 59

## 2022-02-27 NOTE — Progress Notes (Signed)
Benito Mccreedy D.[facility] Phone: [phone]   Assessment and Plan:     1. Left hip pain 2. Strain of left hamstring muscle, subsequent encounter 3. Primary osteoarthritis of left hip 4. Tear of left acetabular labrum, initial encounter -Chronic with exacerbation, subsequent visit - Continued left hip pain consistent with flare of osteoarthritis as well as strain of hamstring tendon origin that is relatively unchanged from previous office visit, though significant improvement in right hip pain after course of p.o. steroids - Plan on continuing conservative therapy at this time including HEP, establishing care with physical therapy, Tylenol as needed for day-to-day pain relief, tramadol as needed for breakthrough pain - Would not recommend CSI to hamstring tendon origin due to partial tearing bilaterally, though discussed PRP with patient and could be considered at future visit. - Could consider CSI to intra-articular hip based on patient's symptoms   Pertinent previous records reviewed include none   Follow Up: As needed. - Would not recommend CSI to hamstring tendon origin due to partial tearing bilaterally, though discussed PRP with patient and could be considered at future visit. - Could consider CSI to intra-articular hip based on patient's symptoms   Subjective:   I, Moenique Parris, am serving as a Education administrator for Doctor Glennon Mac   Chief Complaint: left ischial tuberosity pain    HPI:  11/19/21 Patient is a 56 year old male complaining of left ischial tuberosity pain. Patient states The incident occurred Tuesday was walking/jogging heard a snap . The pain is present in the left thigh.The pain is severe. The pain has been Constant since onset. Pertinent negatives include no loss of motion or numbness. The symptoms are aggravated by movement. He has tried rest, ice and heat for the symptoms. The  treatment provided moderate relief. Pain is affecting sleep. Local ice and heat. Exacerbated by fast  walk.Achy constant and with intermittent sharp with movement, no numbness or tingling the pain does radiate from under the butt cheek to the middle hamstring  He stopped exercising jogging for 2 weeks and pain greatly improved. Hx of rugby player    11/30/2021 Patient states that slight improvement but not what he expected he wanted more of an improvement went for a slight walk and felt a grab isn't able to sit due to comfort level. Sleeping is an issue stopped taking pain med wasn't working made him fuzzy , wanted to talk about an option to smooth out the bone or is the muscle in the way    01/31/2022 Patient states he doing the same   02/28/2022 Patient states he is okay , hasn't been able to get into PT to illness, right side is improving but now left side is like the right has had a flare so he has been resting, would like to know what your thoughts are on why he has bilateral hamstring tears     Relevant Historical Information: Hypertension, CAD, OSA, DM type II  Additional pertinent review of systems negative.   Current Outpatient Medications:    amLODipine (NORVASC) 5 MG tablet, TAKE 1 TABLET BY MOUTH EVERY DAY, Disp: 90 tablet, Rfl: 3   atorvastatin (LIPITOR) 40 MG tablet, TAKE 1 TABLET BY MOUTH EVERYDAY AT BEDTIME, Disp: 90 tablet, Rfl: 3   FARXIGA 10 MG TABS tablet, Take 10 mg by mouth daily., Disp: , Rfl:    hydrochlorothiazide (HYDRODIURIL) 25 MG tablet, TAKE 1 TABLET BY MOUTH EVERY DAY,  Disp: 90 tablet, Rfl: 3   lisinopril (ZESTRIL) 20 MG tablet, TAKE 1 TABLET BY MOUTH EVERY DAY, Disp: 90 tablet, Rfl: 3   metFORMIN (GLUCOPHAGE) 850 MG tablet, Take 1,700 mg by mouth daily. , Disp: , Rfl:    OZEMPIC, 1 MG/DOSE, 4 MG/3ML SOPN, Inject 1 mg into the skin once a week., Disp: , Rfl:    potassium chloride (KLOR-CON) 10 MEQ tablet, TAKE 1 TABLET BY MOUTH EVERY DAY, Disp: 90 tablet, Rfl: 3    pregabalin (LYRICA) 200 MG capsule, Take 200 mg by mouth daily., Disp: , Rfl:    traMADol (ULTRAM) 50 MG tablet, Take 1 tablet (50 mg total) by mouth at bedtime as needed., Disp: 15 tablet, Rfl: 0   zolpidem (AMBIEN) 10 MG tablet, Take 1 tablet (10 mg total) by mouth at bedtime as needed for sleep., Disp: 30 tablet, Rfl: 2   Objective:     Vitals:   02/28/22 1031  BP: 122/80  Weight: (!) 313 lb (142 kg)  Height: '6\' 3"'$  (1.905 m)      Body mass index is 39.12 kg/m.    Physical Exam:     General: awake, alert, and oriented no acute distress, nontoxic Skin: no suspicious lesions or rashes Neuro:sensation intact distally with no dificits, normal muscle tone, no atrophy,.  Hip extension and hamstring strength difficult to ascertain due to patient's tenderness, but otherwise strength 5/5 in all tested lower ext groups Psych: normal mood and affect, speech clear   Left hip: No deformity, swelling or wasting ROM Fexion 90, ext 20 (painful), IR 45, ER 45 Pain with resisted knee flexion localized to proximal hamstring.  Painful with knee at 0 degrees, 90 degrees, full flexion NTTP over the hip flexors, greater troch, glute musculature, si joint, lumbar spine Negative log roll with FROM + FABER + FADIR Negative Piriformis test Negative trendelenberg Gait normal   Electronically signed by:  Benito Mccreedy D.[facility] 10:58 AM 02/28/22

## 2022-02-28 ENCOUNTER — Ambulatory Visit: Payer: 59 | Admitting: Sports Medicine

## 2022-02-28 VITALS — BP 122/80 | Ht 75.0 in | Wt 313.0 lb

## 2022-02-28 DIAGNOSIS — M1612 Unilateral primary osteoarthritis, left hip: Secondary | ICD-10-CM

## 2022-02-28 DIAGNOSIS — S73192A Other sprain of left hip, initial encounter: Secondary | ICD-10-CM

## 2022-02-28 DIAGNOSIS — M25552 Pain in left hip: Secondary | ICD-10-CM

## 2022-02-28 DIAGNOSIS — S76312D Strain of muscle, fascia and tendon of the posterior muscle group at thigh level, left thigh, subsequent encounter: Secondary | ICD-10-CM

## 2022-02-28 NOTE — Patient Instructions (Signed)
Good to see you   

## 2022-05-13 NOTE — Progress Notes (Unsigned)
HPI: [name] is a 56 y.o.male here today for his routine physical examination and follow up.  Last CPE: 04/16/21  He is exercising regularly, 2-3 times per week he goes to the gym to walk the treadmill. He has been consistent with following a healthful diet, he cooks at home and has decreased portions.He has lost weight since his last visit.  Chronic medical problems: OSA on BiPaP, DM II,HLD,peripheral neuropathy,RBBB,CAD,and insomnia among some.  Immunization History  Administered Date(s) Administered   Influenza,inj,Quad PF,6+ Mos 08/25/2019, 09/07/2020   Moderna Sars-Covid-2 Vaccination 11/30/2019, 12/31/2019   PFIZER(Purple Top)SARS-COV-2 Vaccination 11/24/2019, 12/22/2019   Health Maintenance  Topic Date Due   FOOT EXAM  03/25/2020   INFLUENZA VACCINE  04/23/2022   COVID-19 Vaccine (5 - Mixed Product series) 05/30/2022 (Originally 02/25/2020)   HIV Screening  08/24/2024 (Originally 12/27/1980)   OPHTHALMOLOGY EXAM  07/31/2022   HEMOGLOBIN A1C  11/14/2022   TETANUS/TDAP  04/24/2027   COLONOSCOPY (Pts 45-70yr Insurance coverage will need to be confirmed)  09/05/2027   Hepatitis C Screening  Completed   HPV VACCINES  Aged Out   Zoster Vaccines- Shingrix  Discontinued   Last prostate ca screening: Last PSA in 2021 was  0.3 in 07/2020. Nocturia x 0-1.  Diabetes Mellitus II: His endocrinologist has left the practice, he has appt coming to establish with Dr BChalmers Cater He would like labs done today. Last HgA1C in 01/2022 was 6.7.  OSA: Follows with sleep specialist. Peripheral neuropathy on Lyrica 200 mg daily.  Medication helps with bilateral foot pain.  Lab Results  Component Value Date   CHOL 119 02/27/2021   HDL 37 02/27/2021   LDLCALC 51 02/27/2021   TRIG 154 02/27/2021   CHOLHDL 3 03/22/2020   HTN on Amlodipine 5 mg daily, HCTZ 25 mg daily, and lisinopril 20 mg daily. Follows with cardiologist. Lab Results  Component Value Date   CREATININE 1.25  08/20/2021   BUN 21 08/20/2021   NA 139 08/20/2021   K 4.3 08/20/2021   CL 100 08/20/2021   CO2 24 08/20/2021   RLS for a while, symptoms getting worse, affecting his sleep.  He takes Ambien 10 mg daily as needed, not frequently, just when traveling. Constant leg movement seen during sleep study. He has not noted lower extremity erythema, worsening edema, or claudication-like symptoms.  Review of Systems  Constitutional:  Positive for fatigue. Negative for activity change, appetite change and fever.  HENT:  Negative for mouth sores, nosebleeds, sore throat, trouble swallowing and voice change.   Eyes:  Negative for redness and visual disturbance.  Respiratory:  Negative for cough, shortness of breath and wheezing.   Cardiovascular:  Negative for chest pain, palpitations and leg swelling.  Gastrointestinal:  Negative for abdominal pain, blood in stool, nausea and vomiting.  Endocrine: Negative for cold intolerance, heat intolerance, polydipsia, polyphagia and polyuria.  Genitourinary:  Negative for decreased urine volume, dysuria, genital sores, hematuria and testicular pain.  Musculoskeletal:  Positive for arthralgias. Negative for gait problem.  Skin:  Negative for color change and rash.  Allergic/Immunologic: Positive for environmental allergies.  Neurological:  Negative for syncope, weakness and headaches.  Hematological:  Negative for adenopathy. Does not bruise/bleed easily.  Psychiatric/Behavioral:  Positive for sleep disturbance. Negative for confusion.   All other systems reviewed and are negative.  Current Outpatient Medications on File Prior to Visit  Medication Sig Dispense Refill   amLODipine (NORVASC) 5 MG tablet TAKE 1 TABLET BY MOUTH EVERY DAY 90 tablet  3   atorvastatin (LIPITOR) 40 MG tablet TAKE 1 TABLET BY MOUTH EVERYDAY AT BEDTIME 90 tablet 3   FARXIGA 10 MG TABS tablet Take 10 mg by mouth daily.     hydrochlorothiazide (HYDRODIURIL) 25 MG tablet TAKE 1 TABLET BY  MOUTH EVERY DAY 90 tablet 3   lisinopril (ZESTRIL) 20 MG tablet TAKE 1 TABLET BY MOUTH EVERY DAY 90 tablet 3   metFORMIN (GLUCOPHAGE) 850 MG tablet Take 1,700 mg by mouth daily.      potassium chloride (KLOR-CON) 10 MEQ tablet TAKE 1 TABLET BY MOUTH EVERY DAY 90 tablet 3   pregabalin (LYRICA) 200 MG capsule Take 200 mg by mouth daily.     Semaglutide,0.25 or 0.'5MG'$ /DOS, (OZEMPIC, 0.25 OR 0.5 MG/DOSE,) 2 MG/3ML SOPN Inject 2 mg into the skin once a week.     zolpidem (AMBIEN) 10 MG tablet Take 1 tablet (10 mg total) by mouth at bedtime as needed for sleep. 30 tablet 2   No current facility-administered medications on file prior to visit.   Past Medical History:  Diagnosis Date   Allergy    seasonal allergies   Blood in stool    history of blood in stool   Cancer ([place]) 2008   kidney   Chronic kidney disease    Depression    Diabetes mellitus without complication ([place])    Heart murmur    History of colon polyps    Hypertension    Past Surgical History:  Procedure Laterality Date   BACK SURGERY  1989 and [place] AND FIBULA Left 2012   nephrectomy Right    NOSE SURGERY  2015   No Known Allergies  Family History  Problem Relation Age of Onset   Cancer Mother    Early death Mother    Arthritis Father    Alcohol abuse Father    Heart disease Father    Hyperlipidemia Father    Hypertension Father    Social History   Socioeconomic History   Marital status: Married    Spouse name: Not on file   Number of children: 2   Years of education: Not on file   Highest education level: Not on file  Occupational History   Not on file  Tobacco Use   Smoking status: Never   Smokeless tobacco: Never  Vaping Use   Vaping Use: Never used  Substance and Sexual Activity   Alcohol use: Yes   Drug use: Never   Sexual activity: Yes  Other Topics Concern   Not on file  Social History Narrative   Not on file   Social Determinants of Health    Financial Resource Strain: Not on file  Food Insecurity: Not on file  Transportation Needs: Not on file  Physical Activity: Not on file  Stress: Not on file  Social Connections: Not on file   Vitals:   05/14/22 1019  BP: 126/80  Pulse: 91  Resp: 16  Temp: 97.9 F (36.6 C)  SpO2: 99%  Body mass index is 40.84 kg/m. Wt Readings from Last 3 Encounters:  05/14/22 (!) 301 lb 2 oz (136.6 kg)  02/28/22 (!) 313 lb (142 kg)  01/31/22 (!) 319 lb (144.7 kg)   Physical Exam Vitals and nursing note reviewed.  Constitutional:      General: He is not in acute distress.    Appearance: He is well-developed.  HENT:     Head: Normocephalic and atraumatic.     Right  Ear: Tympanic membrane, ear canal and external ear normal.     Left Ear: Tympanic membrane, ear canal and external ear normal.  Eyes:     Extraocular Movements: Extraocular movements intact.     Conjunctiva/sclera: Conjunctivae normal.     Pupils: Pupils are equal, round, and reactive to light.  Neck:     Thyroid: No thyromegaly.     Trachea: No tracheal deviation.  Cardiovascular:     Rate and Rhythm: Normal rate and regular rhythm.     Pulses:          Dorsalis pedis pulses are 2+ on the right side and 2+ on the left side.     Heart sounds: No murmur heard. Pulmonary:     Effort: Pulmonary effort is normal. No respiratory distress.     Breath sounds: Normal breath sounds.  Abdominal:     Palpations: Abdomen is soft. There is no hepatomegaly or mass.     Tenderness: There is no abdominal tenderness.  Genitourinary:    Comments: No concerns. Musculoskeletal:        General: No tenderness.     Cervical back: Normal range of motion.     Comments: No signs of synovitis.  Lymphadenopathy:     Cervical: No cervical adenopathy.     Upper Body:     Right upper body: No supraclavicular adenopathy.     Left upper body: No supraclavicular adenopathy.  Skin:    General: Skin is warm.     Findings: No erythema.   Neurological:     General: No focal deficit present.     Mental Status: He is alert and oriented to person, place, and time.     Cranial Nerves: No cranial nerve deficit.     Sensory: No sensory deficit.     Gait: Gait normal.     Deep Tendon Reflexes:     Reflex Scores:      Bicep reflexes are 2+ on the right side and 2+ on the left side.      Patellar reflexes are 2+ on the right side and 2+ on the left side. Psychiatric:        Mood and Affect: Mood and affect normal.   ASSESSMENT AND PLAN:  Mr.Chayim was seen today for annual exam.  Diagnoses and all orders for this visit: Orders Placed This Encounter  Procedures   PSA(Must document that pt has been informed of limitations of PSA testing.)   Ferritin   Comprehensive metabolic panel   Lipid panel   Hemoglobin A1c   Microalbumin / creatinine urine ratio   LDL cholesterol, direct   Ambulatory referral to Gastroenterology   Lab Results  Component Value Date   HGBA1C 6.4 05/14/2022   Lab Results  Component Value Date   PSA 0.35 05/14/2022    Lab Results  Component Value Date   CREATININE 1.13 05/14/2022   BUN 19 05/14/2022   NA 137 05/14/2022   K 4.0 05/14/2022   CL 98 05/14/2022   CO2 31 05/14/2022   Lab Results  Component Value Date   ALT 24 05/14/2022   AST 14 05/14/2022   ALKPHOS 55 05/14/2022   BILITOT 1.0 05/14/2022   Lab Results  Component Value Date   CHOL 124 05/14/2022   HDL 37.00 (L) 05/14/2022   LDLCALC 51 02/27/2021   LDLDIRECT 64.0 05/14/2022   TRIG 233.0 (H) 05/14/2022   CHOLHDL 3 05/14/2022   Lab Results  Component Value Date   MICROALBUR <0.7  05/14/2022   MICROALBUR 0.4 02/27/2021   Routine general medical examination at a health care facility We discussed the importance of regular physical activity and healthy diet for prevention of chronic illness and/or complications. Preventive guidelines reviewed. Vaccination up-to-date. Next CPE in a year.  Prostate cancer screening -      PSA(Must document that pt has been informed of limitations of PSA testing.)  Colon cancer screening -     Ambulatory referral to Gastroenterology  Type 2 diabetes mellitus with other specified complication ([place]) Last hemoglobin A1c at goal, 6.7 in 02/20/2022. Continue same dose of Farxiga, Ozempic, and metformin. He has an appointment to establish with new endocrinologist, Dr Chalmers Cater.  Polyneuropathy associated with underlying disease ([place]) Problem is well controlled. Continue Lyrica 200 mg daily. Continue adequate foot care and glucose control.  Hyperlipidemia associated with type 2 diabetes mellitus ([facility]) Continue atorvastatin 40 mg daily and low-fat diet. Further recommendation will be given according to lipid panel results.  RLS (restless legs syndrome) Problem is getting worse and affecting sleep. He agrees with trying Requip 1 mg daily at bedtime, he can increase dose to 2 mg in 2 weeks if problem is not greatly improved and medication is well-tolerated. Ferritin added to labs today. Follow-up in 3 months, before if needed.  Return in 3 months (on 08/14/2022) for RLS.  Yoseph Haile G. [place], MD  [facility]. [place] office.

## 2022-05-14 ENCOUNTER — Encounter: Payer: Self-pay | Admitting: Family Medicine

## 2022-05-14 ENCOUNTER — Ambulatory Visit (INDEPENDENT_AMBULATORY_CARE_PROVIDER_SITE_OTHER): Payer: 59 | Admitting: Family Medicine

## 2022-05-14 VITALS — BP 126/80 | HR 91 | Temp 97.9°F | Resp 16 | Ht 72.0 in | Wt 301.1 lb

## 2022-05-14 DIAGNOSIS — E1169 Type 2 diabetes mellitus with other specified complication: Secondary | ICD-10-CM | POA: Diagnosis not present

## 2022-05-14 DIAGNOSIS — Z0000 Encounter for general adult medical examination without abnormal findings: Secondary | ICD-10-CM

## 2022-05-14 DIAGNOSIS — Z1211 Encounter for screening for malignant neoplasm of colon: Secondary | ICD-10-CM

## 2022-05-14 DIAGNOSIS — Z125 Encounter for screening for malignant neoplasm of prostate: Secondary | ICD-10-CM | POA: Diagnosis not present

## 2022-05-14 DIAGNOSIS — G2581 Restless legs syndrome: Secondary | ICD-10-CM

## 2022-05-14 DIAGNOSIS — E785 Hyperlipidemia, unspecified: Secondary | ICD-10-CM

## 2022-05-14 DIAGNOSIS — G63 Polyneuropathy in diseases classified elsewhere: Secondary | ICD-10-CM

## 2022-05-14 LAB — COMPREHENSIVE METABOLIC PANEL
ALT: 24 U/L (ref 0–53)
AST: 14 U/L (ref 0–37)
Albumin: 4.6 g/dL (ref 3.5–5.2)
Alkaline Phosphatase: 55 U/L (ref 39–117)
BUN: 19 mg/dL (ref 6–23)
CO2: 31 mEq/L (ref 19–32)
Calcium: 9.8 mg/dL (ref 8.4–10.5)
Chloride: 98 mEq/L (ref 96–112)
Creatinine, Ser: 1.13 mg/dL (ref 0.40–1.50)
GFR: 71.71 mL/min (ref >60.00)
Glucose, Bld: 106 mg/dL — ABNORMAL HIGH (ref 70–99)
Potassium: 4 mEq/L (ref 3.5–5.1)
Sodium: 137 mEq/L (ref 135–145)
Total Bilirubin: 1 mg/dL (ref 0.2–1.2)
Total Protein: 6.6 g/dL (ref 6.0–8.3)

## 2022-05-14 LAB — HEMOGLOBIN A1C: Hgb A1c MFr Bld: 6.4 % (ref 4.6–6.5)

## 2022-05-14 LAB — FERRITIN: Ferritin: 65.4 ng/mL (ref 22.0–322.0)

## 2022-05-14 LAB — LDL CHOLESTEROL, DIRECT: Direct LDL: 64 mg/dL

## 2022-05-14 LAB — PSA: PSA: 0.35 ng/mL (ref 0.10–4.00)

## 2022-05-14 LAB — LIPID PANEL
Cholesterol: 124 mg/dL (ref 0–200)
HDL: 37 mg/dL — ABNORMAL LOW (ref >39.00)
NonHDL: 86.66
Total CHOL/HDL Ratio: 3
Triglycerides: 233 mg/dL — ABNORMAL HIGH (ref 0.0–149.0)
VLDL: 46.6 mg/dL — ABNORMAL HIGH (ref 0.0–40.0)

## 2022-05-14 MED ORDER — ROPINIROLE HCL 1 MG PO TABS: 1.0000 mg | ORAL_TABLET | Freq: Every day | ORAL | 1 refills | Status: DC

## 2022-05-14 NOTE — Assessment & Plan Note (Signed)
Problem is well controlled. Continue Lyrica 200 mg daily. Continue adequate foot care and glucose control.

## 2022-05-14 NOTE — Assessment & Plan Note (Signed)
Problem is getting worse and affecting sleep. He agrees with trying Requip 1 mg daily at bedtime, he can increase dose to 2 mg in 2 weeks if problem is not greatly improved and medication is well-tolerated. Ferritin added to labs today. Follow-up in 3 months, before if needed.

## 2022-05-14 NOTE — Patient Instructions (Addendum)
A few things to remember from today's visit:  Routine general medical examination at a health care facility  Type 2 diabetes mellitus with other specified complication, without long-term current use of insulin ([place]) - Plan: Comprehensive metabolic panel, Hemoglobin A1c, Microalbumin / creatinine urine ratio  Hyperlipidemia associated with type 2 diabetes mellitus ([place]) - Plan: Lipid panel  Polyneuropathy associated with underlying disease ([place]), Chronic  Prostate cancer screening - Plan: PSA(Must document that pt has been informed of limitations of PSA testing.)  Colon cancer screening - Plan: Ambulatory referral to Gastroenterology  RLS (restless legs syndrome) - Plan: Ferritin, rOPINIRole (REQUIP) 1 MG tablet  If you need refills please call your pharmacy. Do not use My Chart to request refills or for acute issues that need immediate attention.   Please be sure medication list is accurate. If a new problem present, please set up appointment sooner than planned today.  Requip 1 mg started today to help with restless leg syndrome. In 2 weeks you can try 2 tabs if still symptomatic.  Health Maintenance, Male Adopting a healthy lifestyle and getting preventive care are important in promoting health and wellness. Ask your health care provider about: The right schedule for you to have regular tests and exams. Things you can do on your own to prevent diseases and keep yourself healthy. What should I know about diet, weight, and exercise? Eat a healthy diet  Eat a diet that includes plenty of vegetables, fruits, low-fat dairy products, and lean protein. Do not eat a lot of foods that are high in solid fats, added sugars, or sodium. Maintain a healthy weight Body mass index (BMI) is a measurement that can be used to identify possible weight problems. It estimates body fat based on height and weight. Your health care provider can help determine your BMI and help you achieve or maintain a  healthy weight. Get regular exercise Get regular exercise. This is one of the most important things you can do for your health. Most adults should: Exercise for at least 150 minutes each week. The exercise should increase your heart rate and make you sweat (moderate-intensity exercise). Do strengthening exercises at least twice a week. This is in addition to the moderate-intensity exercise. Spend less time sitting. Even light physical activity can be beneficial. Watch cholesterol and blood lipids Have your blood tested for lipids and cholesterol at 58 years of age, then have this test every 5 years. You may need to have your cholesterol levels checked more often if: Your lipid or cholesterol levels are high. You are older than 58 years of age. You are at high risk for heart disease. What should I know about cancer screening? Many types of cancers can be detected early and may often be prevented. Depending on your health history and family history, you may need to have cancer screening at various ages. This may include screening for: Colorectal cancer. Prostate cancer. Skin cancer. Lung cancer. What should I know about heart disease, diabetes, and high blood pressure? Blood pressure and heart disease High blood pressure causes heart disease and increases the risk of stroke. This is more likely to develop in people who have high blood pressure readings or are overweight. Talk with your health care provider about your target blood pressure readings. Have your blood pressure checked: Every 3-5 years if you are 35-48 years of age. Every year if you are 58 years old or older. If you are between the ages of 34 and 64 and are a  current or former smoker, ask your health care provider if you should have a one-time screening for abdominal aortic aneurysm (AAA). Diabetes Have regular diabetes screenings. This checks your fasting blood sugar level. Have the screening done: Once every three years after  age 73 if you are at a normal weight and have a low risk for diabetes. More often and at a younger age if you are overweight or have a high risk for diabetes. What should I know about preventing infection? Hepatitis B If you have a higher risk for hepatitis B, you should be screened for this virus. Talk with your health care provider to find out if you are at risk for hepatitis B infection. Hepatitis C Blood testing is recommended for: Everyone born from [birth year] through 1967. Anyone with known risk factors for hepatitis C. Sexually transmitted infections (STIs) You should be screened each year for STIs, including gonorrhea and chlamydia, if: You are sexually active and are younger than 58 years of age. You are older than 58 years of age and your health care provider tells you that you are at risk for this type of infection. Your sexual activity has changed since you were last screened, and you are at increased risk for chlamydia or gonorrhea. Ask your health care provider if you are at risk. Ask your health care provider about whether you are at high risk for HIV. Your health care provider may recommend a prescription medicine to help prevent HIV infection. If you choose to take medicine to prevent HIV, you should first get tested for HIV. You should then be tested every 3 months for as long as you are taking the medicine. Follow these instructions at home: Alcohol use Do not drink alcohol if your health care provider tells you not to drink. If you drink alcohol: Limit how much you have to 0-2 drinks a day. Know how much alcohol is in your drink. In the [place] one drink equals one 12 oz bottle of beer (355 mL), one 5 oz glass of wine (148 mL), or one 1 oz glass of hard liquor (44 mL). Lifestyle Do not use any products that contain nicotine or tobacco. These products include cigarettes, chewing tobacco, and vaping devices, such as e-cigarettes. If you need help quitting, ask your health care  provider. Do not use street drugs. Do not share needles. Ask your health care provider for help if you need support or information about quitting drugs. General instructions Schedule regular health, dental, and eye exams. Stay current with your vaccines. Tell your health care provider if: You often feel depressed. You have ever been abused or do not feel safe at home. Summary Adopting a healthy lifestyle and getting preventive care are important in promoting health and wellness. Follow your health care provider's instructions about healthy diet, exercising, and getting tested or screened for diseases. Follow your health care provider's instructions on monitoring your cholesterol and blood pressure. This information is not intended to replace advice given to you by your health care provider. Make sure you discuss any questions you have with your health care provider. Document Revised: 01/29/2021 Document Reviewed: 01/29/2021 Elsevier Patient Education  [place].

## 2022-05-14 NOTE — Assessment & Plan Note (Signed)
Continue atorvastatin 40 mg daily and low-fat diet. Further recommendation will be given according to lipid panel results.

## 2022-05-14 NOTE — Assessment & Plan Note (Addendum)
Last hemoglobin A1c at goal, 6.7 in 02/20/2022. Continue same dose of Farxiga, Ozempic, and metformin. He has an appointment to establish with new endocrinologist, Dr Chalmers Cater.

## 2022-05-15 LAB — MICROALBUMIN / CREATININE URINE RATIO
Creatinine,U: 48.4 mg/dL
Microalb Creat Ratio: 1.4 mg/g (ref 0.0–30.0)
Microalb, Ur: <0.7 mg/dL (ref 0.0–1.9)

## 2022-05-17 ENCOUNTER — Telehealth: Payer: Self-pay

## 2022-05-17 NOTE — Telephone Encounter (Signed)
I called and spoke with patient. Informed him his form is ready for pick up. He is aware & will come pick it up this afternoon.

## 2022-05-22 ENCOUNTER — Other Ambulatory Visit: Payer: Self-pay | Admitting: Family Medicine

## 2022-05-22 DIAGNOSIS — I10 Essential (primary) hypertension: Secondary | ICD-10-CM

## 2022-06-08 ENCOUNTER — Other Ambulatory Visit: Payer: Self-pay | Admitting: Family Medicine

## 2022-06-08 DIAGNOSIS — G2581 Restless legs syndrome: Secondary | ICD-10-CM

## 2022-06-18 ENCOUNTER — Other Ambulatory Visit: Payer: Self-pay | Admitting: Cardiovascular Disease

## 2022-06-18 DIAGNOSIS — E1169 Type 2 diabetes mellitus with other specified complication: Secondary | ICD-10-CM

## 2022-08-08 ENCOUNTER — Encounter: Payer: Self-pay | Admitting: Internal Medicine

## 2022-09-10 ENCOUNTER — Ambulatory Visit (INDEPENDENT_AMBULATORY_CARE_PROVIDER_SITE_OTHER): Payer: 59

## 2022-09-10 ENCOUNTER — Encounter (HOSPITAL_COMMUNITY): Payer: Self-pay

## 2022-09-10 ENCOUNTER — Ambulatory Visit (HOSPITAL_COMMUNITY)
Admission: EM | Admit: 2022-09-10 | Discharge: 2022-09-10 | Disposition: A | Discharge status: Home / Self Care | Payer: 59

## 2022-09-10 VITALS — BP 136/80 | HR 81 | Temp 98.0°F | Resp 18

## 2022-09-10 DIAGNOSIS — J329 Chronic sinusitis, unspecified: Secondary | ICD-10-CM | POA: Diagnosis not present

## 2022-09-10 DIAGNOSIS — R61 Generalized hyperhidrosis: Secondary | ICD-10-CM | POA: Diagnosis not present

## 2022-09-10 DIAGNOSIS — R051 Acute cough: Secondary | ICD-10-CM | POA: Diagnosis not present

## 2022-09-10 DIAGNOSIS — R059 Cough, unspecified: Secondary | ICD-10-CM | POA: Diagnosis not present

## 2022-09-10 DIAGNOSIS — B9689 Other specified bacterial agents as the cause of diseases classified elsewhere: Secondary | ICD-10-CM | POA: Diagnosis not present

## 2022-09-10 MED ORDER — LORATADINE 10 MG PO TABS: 10.0000 mg | ORAL_TABLET | Freq: Every day | ORAL | 0 refills | Status: DC

## 2022-09-10 MED ORDER — DOXYCYCLINE HYCLATE 100 MG PO CAPS: 100.0000 mg | ORAL_CAPSULE | Freq: Two times a day (BID) | ORAL | 0 refills | Status: AC

## 2022-09-10 MED ORDER — BENZONATATE 100 MG PO CAPS: 100.0000 mg | ORAL_CAPSULE | Freq: Three times a day (TID) | ORAL | 0 refills | Status: DC

## 2022-09-10 NOTE — Discharge Instructions (Addendum)
Your symptoms likely started as a virus, but have now turned into a bacterial infection.  Take doxycycline every 12 hours for the next 7 days (in the morning with breakfast and with dinner).   You may take tessalon pearles every 8 hours as needed for cough and congestion.  You may take Claritin '10mg'$  once daily to help dry up mucous causing cough.  Your chest x-ray looked great, no pneumonia!   If you develop any new or worsening symptoms or do not improve in the next 2 to 3 days, please return.  If your symptoms are severe, please go to the emergency room.  Follow-up with your primary care provider for further evaluation and management of your symptoms as well as ongoing wellness visits.  I hope you feel better!

## 2022-09-10 NOTE — ED Triage Notes (Signed)
Pt c/o fever, cough, nausea, decrease appetite, and sweaty x9 days. Taking OTC meds with no relief. His PCP was unable to see him and requested to come his d/t hx of DM.

## 2022-09-10 NOTE — ED Provider Notes (Signed)
[facility]    CSN: [id number] Arrival date & time: 09/10/22  1642      History   Chief Complaint Chief Complaint  Patient presents with   Cough    Entered by patient   Fever    HPI Wayne Larsen is a 56 y.o. male.   Patient presents to urgent care for evaluation of cough, nasal congestion, diaphoresis, body aches, and fever/chills that started 9 days ago.  Cough is sometimes productive but mostly dry and worse at nighttime.  He felt as though his symptoms were improving approximately 3 to 4 days ago, but then worsened over the weekend and he became sweaty with fever and chills again. History of deviated septum, plans to have this repaired surgically soon. Reports some nasal congestion and facial discomfort. History of CKD and kidney cancer with nephrectomy, allowed to take ibuprofen in very limited doses. States ibuprofen has not been helping much with the fever/chills and body aches. Reports significant fatigue and intermittent nausea without vomiting, abdominal pain, new low back pain, urinary symptoms, diarrhea, or blood/mucous to the stools. No recent antibiotics or steroid use. He is not a smoker and denies drug use. No history of chronic respiratory problems. No known sick contacts. He has been vaccinated against COVID-19 but is not vaccinated against influenza this year.  No shortness of breath, chest pain, or heart palpitations reported.    Cough Associated symptoms: fever   Fever Associated symptoms: cough     Past Medical History:  Diagnosis Date   Allergy    seasonal allergies   Blood in stool    history of blood in stool   Cancer ([place]) 2008   kidney   Chronic kidney disease    Depression    Diabetes mellitus without complication ([place])    Heart murmur    History of colon polyps    Hypertension     Patient Active Problem List   Diagnosis Date Noted   RLS (restless legs syndrome) 05/14/2022   Chest pain of uncertain etiology 67/20/9470    RBBB 08/20/2021   Polyneuropathy associated with underlying disease ([place]) 03/23/2020   Morbid obesity ([place]) 03/22/2020   Leg edema 11/03/2019   OSA treated with BiPAP 08/03/2019   Insomnia 08/03/2019   Type 2 diabetes mellitus with other specified complication ([place]) 96/28/3662   Benign essential hypertension 05/14/2019   Hyperlipidemia associated with type 2 diabetes mellitus ([place]) 05/14/2019   CAD (coronary artery disease) 05/14/2019   Peripheral neuropathy 05/14/2019    Past Surgical History:  Procedure Laterality Date   [place] Left 2012   nephrectomy Right    NOSE SURGERY  2015       Home Medications    Prior to Admission medications   Medication Sig Start Date End Date Taking? Authorizing Provider  benzonatate (TESSALON) 100 MG capsule Take 1 capsule (100 mg total) by mouth every 8 (eight) hours. 09/10/22  Yes Talbot Grumbling, FNP  doxycycline (VIBRAMYCIN) 100 MG capsule Take 1 capsule (100 mg total) by mouth 2 (two) times daily for 7 days. 09/10/22 09/17/22 Yes Glyn Zendejas, Stasia Cavalier, FNP  loratadine (CLARITIN) 10 MG tablet Take 1 tablet (10 mg total) by mouth daily. 09/10/22  Yes Talbot Grumbling, FNP  amLODipine (NORVASC) 5 MG tablet Take 1 tablet (5 mg total) by mouth daily. 06/18/22   Nahser, Wonda Cheng, MD  atorvastatin (LIPITOR) 40 MG tablet TAKE 1 TABLET  BY MOUTH EVERYDAY AT BEDTIME 06/18/22   Nahser, Wonda Cheng, MD  FARXIGA 10 MG TABS tablet Take 10 mg by mouth daily. 03/01/19   [provider]  hydrochlorothiazide (HYDRODIURIL) 25 MG tablet Take 1 tablet (25 mg total) by mouth daily. 06/18/22   Nahser, Wonda Cheng, MD  lisinopril (ZESTRIL) 20 MG tablet TAKE 1 TABLET BY MOUTH EVERY DAY 05/24/22   [place], Betty G, MD  metFORMIN (GLUCOPHAGE) 850 MG tablet Take 1,700 mg by mouth daily.  04/23/19   [provider]  MOUNJARO 5 MG/0.5ML Pen SMARTSIG:5 Milligram(s) SUB-Q Once a Week     [provider]  potassium chloride (KLOR-CON) 10 MEQ tablet Take 1 tablet (10 mEq total) by mouth daily. 06/18/22   Nahser, Wonda Cheng, MD  pregabalin (LYRICA) 200 MG capsule Take 200 mg by mouth daily. 02/08/19   [provider]  rOPINIRole (REQUIP) 1 MG tablet TAKE 1 TABLET BY MOUTH AT BEDTIME. 06/10/22   [place], Betty G, MD  zolpidem (AMBIEN) 10 MG tablet Take 1 tablet (10 mg total) by mouth at bedtime as needed for sleep. 06/28/20   Rigoberto Noel, MD    Family History Family History  Problem Relation Age of Onset   Cancer Mother    Early death Mother    Arthritis Father    Alcohol abuse Father    Heart disease Father    Hyperlipidemia Father    Hypertension Father     Social History Social History   Tobacco Use   Smoking status: Never   Smokeless tobacco: Never  Vaping Use   Vaping Use: Never used  Substance Use Topics   Alcohol use: Yes   Drug use: Never     Allergies   Patient has no known allergies.   Review of Systems Review of Systems  Constitutional:  Positive for fever.  Respiratory:  Positive for cough.   Per HPI  Physical Exam Triage Vital Signs ED Triage Vitals [09/10/22 1726]  Enc Vitals Group     BP 136/80     Pulse Rate 81     Resp 18     Temp 98 F (36.7 C)     Temp Source Oral     SpO2 98 %     Weight      Height      Head Circumference      Peak Flow      Pain Score 7     Pain Loc      Pain Edu?      Excl. in [place]?    No data found.  Updated Vital Signs BP 136/80 (BP Location: Right Arm)   Pulse 81   Temp 98 F (36.7 C) (Oral)   Resp 18   SpO2 98%   Visual Acuity Right Eye Distance:   Left Eye Distance:   Bilateral Distance:    Right Eye Near:   Left Eye Near:    Bilateral Near:     Physical Exam Vitals and nursing note reviewed.  Constitutional:      Appearance: He is ill-appearing. He is not toxic-appearing.  HENT:     Head: Normocephalic and atraumatic.     Right Ear: Hearing, tympanic membrane,  ear canal and external ear normal.     Left Ear: Hearing, tympanic membrane, ear canal and external ear normal.     Nose: Rhinorrhea present.     Right Sinus: Frontal sinus tenderness present.     Left Sinus: Frontal sinus tenderness  present.     Mouth/Throat:     Lips: Pink.     Mouth: Mucous membranes are moist.     Pharynx: No posterior oropharyngeal erythema.  Eyes:     General: Lids are normal. Vision grossly intact. Gaze aligned appropriately.        Right eye: No discharge.        Left eye: No discharge.     Extraocular Movements: Extraocular movements intact.     Conjunctiva/sclera: Conjunctivae normal.  Cardiovascular:     Rate and Rhythm: Normal rate and regular rhythm.     Heart sounds: Normal heart sounds, S1 normal and S2 normal.  Pulmonary:     Effort: Pulmonary effort is normal. No respiratory distress.     Breath sounds: Normal breath sounds and air entry.  Abdominal:     General: Bowel sounds are normal.     Palpations: Abdomen is soft.     Tenderness: There is no abdominal tenderness. There is no right CVA tenderness, left CVA tenderness or guarding.  Musculoskeletal:     Cervical back: Neck supple.     Right lower leg: No edema.     Left lower leg: No edema.  Lymphadenopathy:     Cervical: Cervical adenopathy present.  Skin:    General: Skin is warm and dry.     Capillary Refill: Capillary refill takes less than 2 seconds.     Findings: No rash.  Neurological:     General: No focal deficit present.     Mental Status: He is alert and oriented to person, place, and time. Mental status is at baseline.     Cranial Nerves: No dysarthria or facial asymmetry.  Psychiatric:        Mood and Affect: Mood normal.        Speech: Speech normal.        Behavior: Behavior normal.        Thought Content: Thought content normal.        Judgment: Judgment normal.      UC Treatments / Results  Labs (all labs ordered are listed, but only abnormal results are  displayed) Labs Reviewed - No data to display  EKG   Radiology DG Chest 2 View  Result Date: 09/10/2022 CLINICAL DATA:  Cough EXAM: CHEST - 2 VIEW COMPARISON:  None Available. FINDINGS: The heart size and mediastinal contours are within normal limits. Both lungs are clear. The visualized skeletal structures are unremarkable. IMPRESSION: No active cardiopulmonary disease. Electronically Signed   By: Franchot Gallo M.D.   On: 09/10/2022 18:00    Procedures Procedures (including critical care time)  Medications Ordered in UC Medications - No data to display  Initial Impression / Assessment and Plan / UC Course  I have reviewed the triage vital signs and the nursing notes.  Pertinent labs & imaging results that were available during my care of the patient were reviewed by me and considered in my medical decision making (see chart for details).   1. Bacterial sinusitis, diaphoresis, acute cough Symptoms likely started as a viral URI and have now progressed to acute bacterial sinusitis further supported by double sickening described in history. We will go ahead and provide antibiotic therapy as symptoms have been present for greater than 1 week. Doxycycline 2 times a day for the next 7 days sent to pharmacy. Chest x-ray negative for acute cardiopulmonary abnormality. May take tessalon pearles every 8 hours as needed for cough. May use Claritin '10mg'$  daily to  help dry up nasal mucous and post-nasal drainage contributing to cough.  Tylenol 1000 mg every 6 hours as needed for fever, chills, or aches and pains may be used.  Follow-up if symptoms fail to improve in the next 3-4 days.  Discussed physical exam and available lab work findings in clinic with patient.  Counseled patient regarding appropriate use of medications and potential side effects for all medications recommended or prescribed today. Discussed red flag signs and symptoms of worsening condition,when to call the PCP office, return to  urgent care, and when to seek higher level of care in the emergency department. Patient verbalizes understanding and agreement with plan. All questions answered. Patient discharged in stable condition.    Final Clinical Impressions(s) / UC Diagnoses   Final diagnoses:  Bacterial sinusitis  Diaphoresis  Acute cough     Discharge Instructions      Your symptoms likely started as a virus, but have now turned into a bacterial infection.  Take doxycycline every 12 hours for the next 7 days (in the morning with breakfast and with dinner).   You may take tessalon pearles every 8 hours as needed for cough and congestion.  You may take Claritin '10mg'$  once daily to help dry up mucous causing cough.  Your chest x-ray looked great, no pneumonia!   If you develop any new or worsening symptoms or do not improve in the next 2 to 3 days, please return.  If your symptoms are severe, please go to the emergency room.  Follow-up with your primary care provider for further evaluation and management of your symptoms as well as ongoing wellness visits.  I hope you feel better!     ED Prescriptions     Medication Sig Dispense Auth. Provider   loratadine (CLARITIN) 10 MG tablet Take 1 tablet (10 mg total) by mouth daily. 30 tablet Joella Prince M, FNP   benzonatate (TESSALON) 100 MG capsule Take 1 capsule (100 mg total) by mouth every 8 (eight) hours. 21 capsule Joella Prince M, FNP   doxycycline (VIBRAMYCIN) 100 MG capsule Take 1 capsule (100 mg total) by mouth 2 (two) times daily for 7 days. 14 capsule Talbot Grumbling, FNP      PDMP not reviewed this encounter.   Talbot Grumbling, [place] 09/10/22 1835

## 2022-09-12 ENCOUNTER — Ambulatory Visit (AMBULATORY_SURGERY_CENTER): Payer: 59

## 2022-09-12 VITALS — Ht 75.0 in | Wt 287.0 lb

## 2022-09-12 DIAGNOSIS — Z86010 Personal history of colonic polyps: Secondary | ICD-10-CM

## 2022-09-12 MED ORDER — NA SULFATE-K SULFATE-MG SULF 17.5-3.13-1.6 GM/177ML PO SOLN: 1.0000 | Freq: Once | ORAL | 0 refills | Status: AC

## 2022-09-12 NOTE — Progress Notes (Signed)
Pre visit completed via phone call; Patient verified name, DOB, and address;  No egg or soy allergy known to patient;  No issues known to pt with past sedation with any surgeries or procedures; Patient denies ever being told they had issues or difficulty with intubation; No FH of Malignant Hyperthermia; Pt is not on diet pills; Pt is not on home 02;  Pt is not on blood thinners;  Pt denies issues with constipation  No A fib or A flutter; Have any cardiac testing pending--NO Pt instructed to use Single[employer] or GoodRx for a price reduction on prep;   Insurance verified during [facility] appt=[employer]  Patient's chart reviewed by Osvaldo Angst CNRA prior to previsit and patient appropriate for the [place].  Previsit completed and red dot placed by patient's name on their procedure day (on provider's schedule).    GoodRx coupon sent with instructions via mail per patient request; instructions also sent via MyChart;

## 2022-09-25 ENCOUNTER — Encounter: Payer: Self-pay | Admitting: Internal Medicine

## 2022-09-26 ENCOUNTER — Encounter: Payer: Self-pay | Admitting: Internal Medicine

## 2022-09-26 ENCOUNTER — Ambulatory Visit (AMBULATORY_SURGERY_CENTER): Payer: 59 | Admitting: Internal Medicine

## 2022-09-26 VITALS — BP 115/76 | HR 76 | Temp 98.7°F | Resp 14 | Ht 75.0 in | Wt 287.0 lb

## 2022-09-26 DIAGNOSIS — D123 Benign neoplasm of transverse colon: Secondary | ICD-10-CM

## 2022-09-26 DIAGNOSIS — Z86010 Personal history of colonic polyps: Secondary | ICD-10-CM

## 2022-09-26 DIAGNOSIS — Z09 Encounter for follow-up examination after completed treatment for conditions other than malignant neoplasm: Secondary | ICD-10-CM | POA: Diagnosis present

## 2022-09-26 HISTORY — PX: COLONOSCOPY: SHX174

## 2022-09-26 MED ORDER — SODIUM CHLORIDE 0.9 % IV SOLN: 500.0000 mL | Freq: Once | INTRAVENOUS | Status: DC

## 2022-09-26 NOTE — Progress Notes (Signed)
Pt's states no medical or surgical changes since previsit or office visit. 

## 2022-09-26 NOTE — Patient Instructions (Addendum)
-   Discharge patient to home (with escort). - Await pathology results. - The findings and recommendations were discussed with the patient.  Handouts on Diverticulosis, hemorrhoids and polyps given.  YOU HAD AN ENDOSCOPIC PROCEDURE TODAY AT [facility]:   Refer to the procedure report that was given to you for any specific questions about what was found during the examination.  If the procedure report does not answer your questions, please call your gastroenterologist to clarify.  If you requested that your care partner not be given the details of your procedure findings, then the procedure report has been included in a sealed envelope for you to review at your convenience later.  YOU SHOULD EXPECT: Some feelings of bloating in the abdomen. Passage of more gas than usual.  Walking can help get rid of the air that was put into your GI tract during the procedure and reduce the bloating. If you had a lower endoscopy (such as a colonoscopy or flexible sigmoidoscopy) you may notice spotting of blood in your stool or on the toilet paper. If you underwent a bowel prep for your procedure, you may not have a normal bowel movement for a few days.  Please Note:  You might notice some irritation and congestion in your nose or some drainage.  This is from the oxygen used during your procedure.  There is no need for concern and it should clear up in a day or so.  SYMPTOMS TO REPORT IMMEDIATELY:  Following lower endoscopy (colonoscopy or flexible sigmoidoscopy):  Excessive amounts of blood in the stool  Significant tenderness or worsening of abdominal pains  Swelling of the abdomen that is new, acute  Fever of 100F or higher  For urgent or emergent issues, a gastroenterologist can be reached at any hour by calling [phone]. Do not use MyChart messaging for urgent concerns.    DIET:  We do recommend a small meal at first, but then you may proceed to your regular diet.  Drink plenty of  fluids but you should avoid alcoholic beverages for 24 hours.  ACTIVITY:  You should plan to take it easy for the rest of today and you should NOT DRIVE or use heavy machinery until tomorrow (because of the sedation medicines used during the test).    FOLLOW UP: Our staff will call the number listed on your records the next business day following your procedure.  We will call around 7:15- 8:00 am to check on you and address any questions or concerns that you may have regarding the information given to you following your procedure. If we do not reach you, we will leave a message.     If any biopsies were taken you will be contacted by phone or by letter within the next 1-3 weeks.  Please call us at [phone] if you have not heard about the biopsies in 3 weeks.    SIGNATURES/CONFIDENTIALITY: You and/or your care partner have signed paperwork which will be entered into your electronic medical record.  These signatures attest to the fact that that the information above on your After Visit Summary has been reviewed and is understood.  Full responsibility of the confidentiality of this discharge information lies with you and/or your care-partner.

## 2022-09-26 NOTE — Op Note (Signed)
[facility] Patient Name: Wayne Larsen Procedure Date: 09/26/2022 8:09 AM MRN: [id number] Endoscopist: Adline Mango [place] , , [id number] Age: 57 Referring MD:  Date of Birth: 09/11/1966 Gender: Male Account #: [id number] Procedure:                Colonoscopy Indications:              High risk colon cancer surveillance: Personal                            history of colonic polyps Medicines:                Monitored Anesthesia Care Procedure:                Pre-Anesthesia Assessment:                           - Prior to the procedure, a History and Physical                            was performed, and patient medications and                            allergies were reviewed. The patient's tolerance of                            previous anesthesia was also reviewed. The risks                            and benefits of the procedure and the sedation                            options and risks were discussed with the patient.                            All questions were answered, and informed consent                            was obtained. Prior Anticoagulants: The patient has                            taken no anticoagulant or antiplatelet agents. ASA                            Grade Assessment: II - A patient with mild systemic                            disease. After reviewing the risks and benefits,                            the patient was deemed in satisfactory condition to                            undergo the procedure.  After obtaining informed consent, the colonoscope                            was passed under direct vision. Throughout the                            procedure, the patient's blood pressure, pulse, and                            oxygen saturations were monitored continuously. The                            CF [id number] #[id number] was introduced through the anus                            and advanced to the the  terminal ileum. The                            colonoscopy was performed without difficulty. The                            patient tolerated the procedure well. The quality                            of the bowel preparation was good. The terminal                            ileum, ileocecal valve, appendiceal orifice, and                            rectum were photographed. Scope In: 8:19:53 AM Scope Out: 8:41:08 AM Scope Withdrawal Time: 0 hours 17 minutes 19 seconds  Total Procedure Duration: 0 hours 21 minutes 15 seconds  Findings:                 The terminal ileum appeared normal.                           Five sessile polyps were found in the transverse                            colon. The polyps were 3 to 6 mm in size. These                            polyps were removed with a cold snare. Resection                            and retrieval were complete.                           Multiple diverticula were found in the sigmoid                            colon.  Non-bleeding internal hemorrhoids were found during                            retroflexion. Complications:            No immediate complications. Estimated Blood Loss:     Estimated blood loss was minimal. Impression:               - The examined portion of the ileum was normal.                           - Five 3 to 6 mm polyps in the transverse colon,                            removed with a cold snare. Resected and retrieved.                           - Diverticulosis in the sigmoid colon.                           - Non-bleeding internal hemorrhoids. Recommendation:           - Discharge patient to home (with escort).                           - Await pathology results.                           - The findings and recommendations were discussed                            with the patient. Dr Georgian Co "Lyndee Leo" Lorenso Courier,  09/26/2022 8:48:27 AM

## 2022-09-26 NOTE — Progress Notes (Signed)
GASTROENTEROLOGY PROCEDURE H&P NOTE   Primary Care Physician: Martinique, Betty G, MD    Reason for Procedure:   History of colon polyps  Plan:    Colonoscopy  Patient is appropriate for endoscopic procedure(s) in the ambulatory ([place]) setting.  The nature of the procedure, as well as the risks, benefits, and alternatives were carefully and thoroughly reviewed with the patient. Ample time for discussion and questions allowed. The patient understood, was satisfied, and agreed to proceed.     HPI: Wayne Larsen is a 57 y.o. male who presents for colonoscopy for history of colon polyps. Denies blood in stools, changes in bowel habits, or unintentional weight loss. Denies family history of colon cancer.   Past Medical History:  Diagnosis Date   Blood in stool    history of blood in stool   Cancer ([place]) 2008   kidney-R   Chronic kidney disease    Depression    Diabetes mellitus without complication ([place])    on meds   Heart murmur    History of colon polyps    Hypertension    Myocardial infarction ([place])    hx of   Seasonal allergies    Sleep apnea    uses CPAP    Past Surgical History:  Procedure Laterality Date   BACK SURGERY  1989   and 1990   COLONOSCOPY  2018   [place] AND FIBULA Left 2012   NEPHRECTOMY Right 2008   NOSE SURGERY  2015    Prior to Admission medications   Medication Sig Start Date End Date Taking? Authorizing Provider  amLODipine (NORVASC) 5 MG tablet Take 1 tablet (5 mg total) by mouth daily. 06/18/22  Yes Nahser, Wonda Cheng, MD  atorvastatin (LIPITOR) 40 MG tablet TAKE 1 TABLET BY MOUTH EVERYDAY AT BEDTIME 06/18/22  Yes Nahser, Wonda Cheng, MD  FARXIGA 10 MG TABS tablet Take 10 mg by mouth daily. 03/01/19  Yes [provider]  hydrochlorothiazide (HYDRODIURIL) 25 MG tablet Take 1 tablet (25 mg total) by mouth daily. 06/18/22  Yes Nahser, Wonda Cheng, MD  lisinopril (ZESTRIL) 20 MG tablet TAKE 1  TABLET BY MOUTH EVERY DAY 05/24/22  Yes Martinique, Betty G, MD  METFORMIN HCL PO Take 750 mg by mouth 2 (two) times daily with a meal. 04/23/19  Yes [provider]  MOUNJARO 5 MG/0.5ML Pen SMARTSIG:5 Milligram(s) SUB-Q Once a Week   Yes [provider]  potassium chloride (KLOR-CON) 10 MEQ tablet Take 1 tablet (10 mEq total) by mouth daily. 06/18/22  Yes Nahser, Wonda Cheng, MD  pregabalin (LYRICA) 200 MG capsule Take 200 mg by mouth daily. 02/08/19  Yes [provider]  rOPINIRole (REQUIP) 1 MG tablet TAKE 1 TABLET BY MOUTH AT BEDTIME. 06/10/22  Yes Martinique, Betty G, MD  loratadine (CLARITIN) 10 MG tablet Take 1 tablet (10 mg total) by mouth daily. Patient not taking: Reported on 09/26/2022 09/10/22   Talbot Grumbling, FNP  zolpidem (AMBIEN) 10 MG tablet Take 1 tablet (10 mg total) by mouth at bedtime as needed for sleep. Patient not taking: Reported on 09/12/2022 06/28/20   Rigoberto Noel, MD    Current Outpatient Medications  Medication Sig Dispense Refill   amLODipine (NORVASC) 5 MG tablet Take 1 tablet (5 mg total) by mouth daily. 90 tablet 1   atorvastatin (LIPITOR) 40 MG tablet TAKE 1 TABLET BY MOUTH EVERYDAY AT BEDTIME 90 tablet 0   FARXIGA 10 MG  TABS tablet Take 10 mg by mouth daily.     hydrochlorothiazide (HYDRODIURIL) 25 MG tablet Take 1 tablet (25 mg total) by mouth daily. 90 tablet 1   lisinopril (ZESTRIL) 20 MG tablet TAKE 1 TABLET BY MOUTH EVERY DAY 90 tablet 3   METFORMIN HCL PO Take 750 mg by mouth 2 (two) times daily with a meal.     MOUNJARO 5 MG/0.5ML Pen SMARTSIG:5 Milligram(s) SUB-Q Once a Week     potassium chloride (KLOR-CON) 10 MEQ tablet Take 1 tablet (10 mEq total) by mouth daily. 90 tablet 1   pregabalin (LYRICA) 200 MG capsule Take 200 mg by mouth daily.     rOPINIRole (REQUIP) 1 MG tablet TAKE 1 TABLET BY MOUTH AT BEDTIME. 90 tablet 2   loratadine (CLARITIN) 10 MG tablet Take 1 tablet (10 mg total) by mouth daily. (Patient not taking: Reported  on 09/26/2022) 30 tablet 0   zolpidem (AMBIEN) 10 MG tablet Take 1 tablet (10 mg total) by mouth at bedtime as needed for sleep. (Patient not taking: Reported on 09/12/2022) 30 tablet 2   Current Facility-Administered Medications  Medication Dose Route Frequency Provider Last Rate Last Admin   0.9 %  sodium chloride infusion  500 mL Intravenous Once Sharyn Creamer, MD       0.9 %  sodium chloride infusion  500 mL Intravenous Once Sharyn Creamer, MD        Allergies as of 09/26/2022   (No Known Allergies)    Family History  Problem Relation Age of Onset   Cancer Mother    Early death Mother    Arthritis Father    Alcohol abuse Father    Heart disease Father    Hyperlipidemia Father    Hypertension Father    Crohn's disease Neg Hx    Colon polyps Neg Hx    Colon cancer Neg Hx    Esophageal cancer Neg Hx    Stomach cancer Neg Hx    Rectal cancer Neg Hx     Social History   Socioeconomic History   Marital status: Married    Spouse name: Not on file   Number of children: 2   Years of education: Not on file   Highest education level: Not on file  Occupational History   Not on file  Tobacco Use   Smoking status: Never   Smokeless tobacco: Never  Vaping Use   Vaping Use: Never used  Substance and Sexual Activity   Alcohol use: Not Currently    Alcohol/week: 0.0 - 2.0 standard drinks of alcohol   Drug use: Never   Sexual activity: Yes  Other Topics Concern   Not on file  Social History Narrative   Not on file   Social Determinants of Health   Financial Resource Strain: Not on file  Food Insecurity: Not on file  Transportation Needs: Not on file  Physical Activity: Not on file  Stress: Not on file  Social Connections: Not on file  Intimate Partner Violence: Not on file    Physical Exam: Vital signs in last 24 hours: BP 134/85   Pulse 91   Temp 98.7 F (37.1 C) (Temporal)   Resp 16   Ht '6\' 3"'$  (1.905 m)   Wt 287 lb (130.2 kg)   SpO2 97%   BMI 35.87 kg/m   GEN: NAD EYE: Sclerae anicteric ENT: MMM CV: Non-tachycardic Pulm: No increased work of breathing GI: Soft, NT/ND NEURO:  Alert & Oriented  Christia Reading, MD [facility]  09/26/2022 8:17 AM

## 2022-09-26 NOTE — Progress Notes (Signed)
Called to room to assist during endoscopic procedure.  Patient ID and intended procedure confirmed with present staff. Received instructions for my participation in the procedure from the performing physician.  

## 2022-09-26 NOTE — Progress Notes (Signed)
Report to pacu rn. Vss. Care resumed by rn. 

## 2022-09-27 ENCOUNTER — Telehealth: Payer: Self-pay

## 2022-09-27 NOTE — Telephone Encounter (Signed)
Left message on follow up call. 

## 2022-10-02 ENCOUNTER — Encounter: Payer: Self-pay | Admitting: Internal Medicine

## 2022-11-28 ENCOUNTER — Encounter: Payer: Self-pay | Admitting: Cardiovascular Disease

## 2022-11-28 NOTE — Progress Notes (Unsigned)
Cardiology Office Note:    Date:  11/30/2022   ID:  Wayne Larsen, DOB 1966-01-01, MRN [id number]  PCP:  Martinique, Betty G, MD  Cardiologist:  Mertie Moores, MD  Electrophysiologist:  None   Referring MD: Martinique, Betty G, MD   Chief Complaint  Patient presents with   Hypertension       Wayne Larsen is a 57 y.o. male with a hx of hypertension, hyperlipidemia.  He recently moved from [place] and is here to establish care.  We were asked to see him for his HTN and hyperlipidemia by Dr. Martinique .   Hx of renal cell cancer - s/p right nephrectomy    Originally from [place] .  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  Has been told that had a MI years ago.  Has not had cath or stenting .   Fairly active now.   Luz [place] quite a bit .  [place] .   Trying to start an exercise program .  Works 16 hour days .   No CP , works in yard without any issues.  Eats quite a bit out on the road.  And when he is at home he cooks foods that are typically preprepared.  November 03, 2019:  BP is much better ,  Edema is much better after reducing the amlodipine.  Is on  We added HCTZ and kdur  BP is much better  Wt is 335 lbs.  ( down 7 lbs from previous visit in Nov. 2021)   He is feeling much better.  He admits that he still eats too much.  Nov. 30, 2021: Seen today for follow up of HTN and HLD  Wt. Is 337 lbs  ( up 2 lbs from previous )   Lipid profile from March 22, 2020 looks good.  His total cholesterol is 109.  HDL is 39.  LDL is 45.  Triglyceride levels 123.  Does not work for [employer] any longer , had to travel too much , is currently between jobs.   No leg swelling  No chest pain  Is exercising regularly    Nov. 28, 2022 Wayne Larsen is seen today for follow up of his HTN and  HLD  Wt is 316 ( down 21 lbs from previous exam)  Has limited his diet. Eating much better.  Has been having some chest pain Especially in the evening Has been walking / jogging almost daily  Has some upper abdominal / lower chest pain with running  Also has some CP in the evenings   November 26, 2021; Wayne Larsen is seen for follow up of his HTN and HLD Wt is 316  ( former Education administrator )  He had been having some chest pain   Cor CTA showed Coronary calcium score of 73. This was 18 percentile for age and sex matched control. AV - Bicuspid AV   ( echo appears to show a 3 leaflet valve)  RCA : mild prox plaque LM: normal LAD :  mild prox plaque LCx:  non dom, large OM1,  small area of minimal plaque  Echo shows normal LV function,  grade 1 DD, trivial AI Asc. Aorta is borderline dilated  The AV appears to have 3 leaflets on echo  He  does not have a significant murmur to suggest aortic valve pathology. I suspect the bileaflet AV comment on the CTA is a typo.   Tore him hamstring while jogging  I recommended  cycling   Lipids were reviewed - LDL is 59   March 8 , 2024   Wayne Larsen is seen for follow up of his HTN, mild CAD  Cor CTA suggests a bicuspid AV Echo shows 3 leaflets Wt is 301.8 Has been exercising more,  watching his diet  Feels better  No cp  , no dyspnea  Is jogging regularly   Is going to Dr. Cherlyn Cushing ( ENT at [facility])  Will need nasal  septal surgery   He is at low risk for his nasal septal surgery . He will not need any medication changes from my standpoint    Past Medical History:  Diagnosis Date   Blood in stool    history of blood in stool   Cancer ([place]) 2008   kidney-R   Chronic kidney disease    Depression    Diabetes mellitus without complication ([facility])    on meds   Heart murmur    History of colon polyps    Hypertension    Myocardial infarction ([place])    hx of   Seasonal allergies    Sleep apnea    uses CPAP    Past Surgical History:  Procedure  Laterality Date   BACK SURGERY  1989   and 1990   COLONOSCOPY  2018   [place] AND FIBULA Left 2012   NEPHRECTOMY Right 2008   NOSE SURGERY  2015    Current Medications: Current Meds  Medication Sig   amLODipine (NORVASC) 5 MG tablet Take 1 tablet (5 mg total) by mouth daily.   atorvastatin (LIPITOR) 40 MG tablet TAKE 1 TABLET BY MOUTH EVERYDAY AT BEDTIME   FARXIGA 10 MG TABS tablet Take 10 mg by mouth daily.   hydrochlorothiazide (HYDRODIURIL) 25 MG tablet Take 1 tablet (25 mg total) by mouth daily.   lisinopril (ZESTRIL) 20 MG tablet TAKE 1 TABLET BY MOUTH EVERY DAY   METFORMIN HCL PO Take 750 mg by mouth daily.   MOUNJARO 5 MG/0.5ML Pen SMARTSIG:5 Milligram(s) SUB-Q Once a Week   potassium chloride (KLOR-CON) 10 MEQ tablet Take 1 tablet (10 mEq total) by mouth daily.   pregabalin (LYRICA) 200 MG capsule Take 200 mg by mouth daily.   rOPINIRole (REQUIP) 1 MG tablet TAKE 1 TABLET BY MOUTH AT BEDTIME.   zolpidem (AMBIEN) 10 MG tablet Take 1 tablet (10 mg total) by mouth at bedtime as needed for sleep.   Current Facility-Administered Medications for the 11/29/22 encounter (Office Visit) with Dynver Clemson, Wonda Cheng, MD  Medication   0.9 %  sodium chloride infusion     Allergies:   Patient has no known allergies.   Social History   Socioeconomic History   Marital status: Married    Spouse name: Not on file   Number of children: 2   Years of education: Not on file   Highest education level: Not on file  Occupational History   Not on file  Tobacco Use   Smoking status: Never   Smokeless tobacco: Never  Vaping Use   Vaping Use: Never used  Substance and Sexual Activity   Alcohol use: Not Currently    Alcohol/week: 0.0 - 2.0 standard drinks of alcohol   Drug use: Never   Sexual activity: Yes  Other Topics Concern   Not on file  Social History Narrative   Not on file   Social Determinants of Health   Financial Resource Strain:  Not on file  Food Insecurity: Not on file  Transportation Needs: Not on file  Physical Activity: Not on file  Stress: Not on file  Social Connections: Not on file     Family History: The patient's family history includes Alcohol abuse in his father; Arthritis in his father; Cancer in his mother; Early death in his mother; Heart disease in his father; Hyperlipidemia in his father; Hypertension in his father. There is no history of Crohn's disease, Colon polyps, Colon cancer, Esophageal cancer, Stomach cancer, or Rectal cancer.  ROS:   Please see the history of present illness.     All other systems reviewed and are negative.  EKGs/Labs/Other Studies Reviewed:    The following studies were reviewed today:   EKG:      Mar. 8, 2024. NSR at 71.  RBBB , minimal voltage criteria for LVH   Recent Labs: 05/14/2022: ALT 24; BUN 19; Creatinine, Ser 1.13; Potassium 4.0; Sodium 137  Recent Lipid Panel    Component Value Date/Time   CHOL 124 05/14/2022 1110   TRIG 233.0 (H) 05/14/2022 1110   HDL 37.00 (L) 05/14/2022 1110   CHOLHDL 3 05/14/2022 1110   VLDL 46.6 (H) 05/14/2022 1110   LDLCALC 51 02/27/2021 0000   LDLDIRECT 64.0 05/14/2022 1110    Physical Exam:     Physical Exam: Blood pressure 132/88, pulse 71, height '6\' 3"'$  (1.905 m), weight (!) 301 lb 12.8 oz (136.9 kg), SpO2 97 %.       GEN:  Well nourished, well developed in no acute distress HEENT: Normal NECK: No JVD; No carotid bruits LYMPHATICS: No lymphadenopathy CARDIAC: RRR , no murmurs, rubs, gallops RESPIRATORY:  Clear to auscultation without rales, wheezing or rhonchi  ABDOMEN: Soft, non-tender, non-distended MUSCULOSKELETAL:  No edema; No deformity  SKIN: Warm and dry NEUROLOGIC:  Alert and oriented x 3     ASSESSMENT:    1. Benign essential hypertension   2. Hyperlipidemia associated with type 2 diabetes mellitus ([place])   3. RBBB      PLAN:     1.   Exertional chest pain.     No recent cp .    2.    Essential hypertension:    BP is well controlled. . Cont current meds      3.   Hyperlipidemia: labs from Oct. 2023 look good  Continue meds   4.  Pre op for nasal septal surgery .   Wayne Larsen is at low risk for his upcoming septal surgery .     Medication Adjustments/Labs and Tests Ordered: Current medicines are reviewed at length with the patient today.  Concerns regarding medicines are outlined above.  Orders Placed This Encounter  Procedures   EKG 12-Lead    No orders of the defined types were placed in this encounter.       Patient Instructions  Medication Instructions:  Your physician recommends that you continue on your current medications as directed. Please refer to the Current Medication list given to you today.  *If you need a refill on your cardiac medications before your next appointment, please call your pharmacy*   Lab Work: NONE If you have labs (blood work) drawn today and your tests are completely normal, you will receive your results only by: [place] (if you have MyChart) OR A paper  copy in the mail If you have any lab test that is abnormal or we need to change your treatment, we will call you to review the results.   Testing/Procedures: **Cleared for upcoming procedure**   Follow-Up: At [facility], you and your health needs are our priority.  As part of our continuing mission to provide you with exceptional heart care, we have created designated Provider Care Teams.  These Care Teams include your primary Cardiologist (physician) and Advanced Practice Providers (APPs -  Physician Assistants and Nurse Practitioners) who all work together to provide you with the care you need, when you need it.  We recommend signing up for the patient portal called "MyChart".  Sign up information is provided on this After Visit Summary.  MyChart is used to connect with patients for Virtual Visits (Telemedicine).  Patients are able to view lab/test results,  encounter notes, upcoming appointments, etc.  Non-urgent messages can be sent to your provider as well.   To learn more about what you can do with MyChart, go to [email or web address].    Your next appointment:   1 year(s)  Provider:   Mertie Moores, MD        Signed, Mertie Moores, MD  11/30/2022 7:20 AM    [place]

## 2022-11-29 ENCOUNTER — Encounter: Payer: Self-pay | Admitting: Cardiovascular Disease

## 2022-11-29 ENCOUNTER — Ambulatory Visit: Payer: 59 | Attending: Cardiovascular Disease | Admitting: Cardiovascular Disease

## 2022-11-29 VITALS — BP 132/88 | HR 71 | Ht 75.0 in | Wt 301.8 lb

## 2022-11-29 DIAGNOSIS — E785 Hyperlipidemia, unspecified: Secondary | ICD-10-CM

## 2022-11-29 DIAGNOSIS — E1169 Type 2 diabetes mellitus with other specified complication: Secondary | ICD-10-CM | POA: Diagnosis not present

## 2022-11-29 DIAGNOSIS — I4510 Unspecified right bundle-branch block: Secondary | ICD-10-CM | POA: Diagnosis not present

## 2022-11-29 DIAGNOSIS — I10 Essential (primary) hypertension: Secondary | ICD-10-CM | POA: Diagnosis not present

## 2022-11-29 NOTE — Patient Instructions (Signed)
Medication Instructions:  Your physician recommends that you continue on your current medications as directed. Please refer to the Current Medication list given to you today.  *If you need a refill on your cardiac medications before your next appointment, please call your pharmacy*   Lab Work: NONE If you have labs (blood work) drawn today and your tests are completely normal, you will receive your results only by: [place] (if you have MyChart) OR A paper copy in the mail If you have any lab test that is abnormal or we need to change your treatment, we will call you to review the results.   Testing/Procedures: **Cleared for upcoming procedure**   Follow-Up: At [facility], you and your health needs are our priority.  As part of our continuing mission to provide you with exceptional heart care, we have created designated Provider Care Teams.  These Care Teams include your primary Cardiologist (physician) and Advanced Practice Providers (APPs -  Physician Assistants and Nurse Practitioners) who all work together to provide you with the care you need, when you need it.  We recommend signing up for the patient portal called "MyChart".  Sign up information is provided on this After Visit Summary.  MyChart is used to connect with patients for Virtual Visits (Telemedicine).  Patients are able to view lab/test results, encounter notes, upcoming appointments, etc.  Non-urgent messages can be sent to your provider as well.   To learn more about what you can do with MyChart, go to [email or web address].    Your next appointment:   1 year(s)  Provider:   Mertie Moores, MD

## 2022-12-01 ENCOUNTER — Other Ambulatory Visit: Payer: Self-pay | Admitting: Cardiovascular Disease

## 2022-12-01 DIAGNOSIS — E1169 Type 2 diabetes mellitus with other specified complication: Secondary | ICD-10-CM

## 2023-02-09 ENCOUNTER — Other Ambulatory Visit: Payer: Self-pay | Admitting: Cardiovascular Disease

## 2023-02-28 ENCOUNTER — Other Ambulatory Visit: Payer: Self-pay | Admitting: Cardiovascular Disease

## 2023-02-28 ENCOUNTER — Other Ambulatory Visit: Payer: Self-pay | Admitting: Family Medicine

## 2023-02-28 DIAGNOSIS — G2581 Restless legs syndrome: Secondary | ICD-10-CM

## 2023-03-01 ENCOUNTER — Other Ambulatory Visit: Payer: Self-pay | Admitting: Cardiovascular Disease

## 2023-03-06 ENCOUNTER — Other Ambulatory Visit: Payer: Self-pay | Admitting: Family Medicine

## 2023-03-06 DIAGNOSIS — I10 Essential (primary) hypertension: Secondary | ICD-10-CM

## 2023-04-23 ENCOUNTER — Encounter (INDEPENDENT_AMBULATORY_CARE_PROVIDER_SITE_OTHER): Payer: Self-pay

## 2023-05-14 NOTE — Progress Notes (Unsigned)
HPI: [name] is a 57 y.o.male with PMHx significant for DM II,HLD,OSA,CAD,RBBB, and peripheral neuropathy here today for his routine physical examination.  Last CPE: 05/14/22  No new problems since his last visit.  He has been jogging three miles every night. His diet consists of vegetables, fish, chicken, and has minimized red meat. He denies smoking and consumes alcohol minimally.  Hx of insomnia, sleep averaging around five hours per night. He takes Ambien 10 mg as needed.  Immunization History  Administered Date(s) Administered   Influenza,inj,Quad PF,6+ Mos 08/25/2019, 09/07/2020   Moderna Sars-Covid-2 Vaccination 11/30/2019, 12/31/2019   PFIZER(Purple Top)SARS-COV-2 Vaccination 11/24/2019, 12/22/2019    Health Maintenance  Topic Date Due   DTaP/Tdap/Td (1 - Tdap) Never done   FOOT EXAM  03/25/2020   OPHTHALMOLOGY EXAM  07/31/2022   HEMOGLOBIN A1C  11/14/2022   Diabetic kidney evaluation - eGFR measurement  05/15/2023   Diabetic kidney evaluation - Urine ACR  05/15/2023   INFLUENZA VACCINE  05/21/2023 (Originally 04/24/2023)   COVID-19 Vaccine (5 - 2023-24 season) 06/01/2023 (Originally 05/24/2022)   HIV Screening  08/24/2024 (Originally 12/27/1980)   Colonoscopy  09/26/2025   Hepatitis C Screening  Completed   HPV VACCINES  Aged Out   Zoster Vaccines- Shingrix  Discontinued   Last prostate ca screening: Nocturia x 0-1. Lab Results  Component Value Date   PSA 0.35 05/14/2022   Concerns and/or follow up today:   DM II: Follows with endocrinologist. He has a f/u appt next week. Would like HgA1C added to his labs today. Metformin and Ozempic were discontinued and Mounjaro started.  Lab Results  Component Value Date   HGBA1C 6.4 05/14/2022   HTN: He is on Amlodipine 5 mg daily, HCTZ 25 mg daily, and lisinopril 20 mg daily. Home BP's 120's/70-80. Follows with cardiologist regularly.  OSA on BiPaP, follows with sleep specialist.  Lab Results   Component Value Date   NA 137 05/14/2022   CL 98 05/14/2022   K 4.0 05/14/2022   CO2 31 05/14/2022   BUN 19 05/14/2022   CREATININE 1.13 05/14/2022   GFR 71.71 05/14/2022   CALCIUM 9.8 05/14/2022   ALBUMIN 4.6 05/14/2022   GLUCOSE 106 (H) 05/14/2022   HLD on Atorvastatin 40 mg daily.  Lab Results  Component Value Date   CHOL 124 05/14/2022   HDL 37.00 (L) 05/14/2022   LDLCALC 51 02/27/2021   LDLDIRECT 64.0 05/14/2022   TRIG 233.0 (H) 05/14/2022   CHOLHDL 3 05/14/2022   RLS symptoms not well controlled with Ropinirole 1 mg at bedtime. C/O neuropathic pain. He experiences  cold like sensation and shooting pain in his toes, and has a constant urge to move his legs.  Symptoms affect his sleep.  He is currently taking Lyrica 200 mg daily, prescribed by his endocrinologist, for neuropathy.  He mentions that he experiences occasional ear pruritus, no changes in hearing. He has attempted to alleviate with ear drops and Q-tips.Problem has improved.  Review of Systems  Constitutional:  Positive for fatigue. Negative for activity change, appetite change and fever.  HENT:  Negative for mouth sores, nosebleeds, sore throat and trouble swallowing.   Eyes:  Negative for redness and visual disturbance.  Respiratory:  Negative for cough, shortness of breath and wheezing.   Cardiovascular:  Negative for chest pain, palpitations and leg swelling.  Gastrointestinal:  Negative for abdominal pain, blood in stool, nausea and vomiting.  Endocrine: Negative for cold intolerance, heat intolerance, polydipsia, polyphagia and polyuria.  Genitourinary:  Negative for decreased urine volume, dysuria, genital sores, hematuria and testicular pain.  Musculoskeletal:  Positive for arthralgias. Negative for gait problem.  Skin:  Negative for color change and rash.  Allergic/Immunologic: Positive for environmental allergies.  Neurological:  Negative for syncope, weakness and headaches.  Hematological:   Negative for adenopathy. Does not bruise/bleed easily.  Psychiatric/Behavioral:  Positive for sleep disturbance. Negative for confusion.   All other systems reviewed and are negative.  Current Outpatient Medications on File Prior to Visit  Medication Sig Dispense Refill   amLODipine (NORVASC) 5 MG tablet TAKE 1 TABLET (5 MG TOTAL) BY MOUTH DAILY. 90 tablet 3   atorvastatin (LIPITOR) 40 MG tablet TAKE 1 TABLET BY MOUTH EVERYDAY AT BEDTIME 90 tablet 3   FARXIGA 10 MG TABS tablet Take 10 mg by mouth daily.     hydrochlorothiazide (HYDRODIURIL) 25 MG tablet TAKE 1 TABLET (25 MG TOTAL) BY MOUTH DAILY. 90 tablet 3   lisinopril (ZESTRIL) 20 MG tablet TAKE 1 TABLET BY MOUTH EVERY DAY 90 tablet 1   METFORMIN HCL PO Take 750 mg by mouth daily.     MOUNJARO 5 MG/0.5ML Pen SMARTSIG:5 Milligram(s) SUB-Q Once a Week     potassium chloride (KLOR-CON) 10 MEQ tablet TAKE 1 TABLET BY MOUTH EVERY DAY 90 tablet 3   pregabalin (LYRICA) 200 MG capsule Take 200 mg by mouth daily.     zolpidem (AMBIEN) 10 MG tablet Take 1 tablet (10 mg total) by mouth at bedtime as needed for sleep. 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   Chronic kidney disease    Depression    Diabetes mellitus without complication (HCC)    on meds   Heart murmur    History of colon polyps    Hypertension    Myocardial infarction (HCC)    hx of   Seasonal allergies    Sleep apnea    uses CPAP    Past Surgical History:  Procedure Laterality Date   BACK SURGERY  1989   and 1990   [place]   LEG AMPUTATION THROUGH LOWER TIBIA AND FIBULA Left 2012   NEPHRECTOMY Right 2008   NOSE SURGERY  2015     No Known Allergies  Family History  Problem Relation Age of Onset   Cancer Mother    Early death Mother    Arthritis Father    Alcohol abuse Father    Heart disease Father    Hyperlipidemia Father    Hypertension Father    Crohn's disease Neg Hx    Colon polyps Neg Hx    Colon cancer Neg Hx    Esophageal cancer Neg Hx    Stomach cancer Neg Hx    Rectal cancer Neg Hx     Social History   Socioeconomic History   Marital status: Married    Spouse name: Not on file   Number of children: 2   Years of education: Not on file   Highest education level: Not on file  Occupational History   Not on file  Tobacco Use  Smoking status: Never   Smokeless tobacco: Never  Vaping Use   Vaping status: Never Used  Substance and Sexual Activity   Alcohol use: Not Currently    Alcohol/week: 0.0 - 2.0 standard drinks of alcohol   Drug use: Never   Sexual activity: Yes  Other Topics Concern   Not on file  Social History Narrative   Not on file   Social Determinants of Health   Financial Resource Strain: Not on file  Food Insecurity: Not on file  Transportation Needs: Not on file  Physical Activity: Not on file  Stress: Not on file  Social Connections: Not on file   Vitals:   05/16/23 0908 05/16/23 1008  BP: (!) 138/90 120/80  Pulse: 60   Resp: 16   Temp: 97.9 F (36.6 C)   SpO2: 98%    Body mass index is 36.95 kg/m.  Wt Readings from Last 3 Encounters:  05/16/23 (!) 300 lb 6 oz (136.2 kg)  11/29/22 (!) 301 lb 12.8 oz (136.9 kg)  09/26/22 287 lb (130.2 kg)   Physical Exam Vitals and nursing note reviewed.  Constitutional:      General: He is not in acute distress.    Appearance: He is well-developed.  HENT:     Head: Normocephalic and atraumatic.     Right Ear: Tympanic membrane, ear canal and external ear normal.     Left Ear: Tympanic membrane, ear canal and external ear normal.     Mouth/Throat:     Mouth: Mucous membranes are moist.     Pharynx:  Oropharynx is clear.  Eyes:     Extraocular Movements: Extraocular movements intact.     Conjunctiva/sclera: Conjunctivae normal.     Pupils: Pupils are equal, round, and reactive to light.  Neck:     Thyroid: No thyroid mass.  Cardiovascular:     Rate and Rhythm: Normal rate and regular rhythm.     Pulses:          Dorsalis pedis pulses are 2+ on the right side and 2+ on the left side.     Heart sounds: No murmur heard.    Comments: Varicose veins LE, bilateral. Pulmonary:     Effort: Pulmonary effort is normal. No respiratory distress.     Breath sounds: Normal breath sounds.  Abdominal:     Palpations: Abdomen is soft. There is no hepatomegaly or mass.     Tenderness: There is no abdominal tenderness.  Genitourinary:    Comments: No concerns. Musculoskeletal:        General: No tenderness.     Cervical back: Normal range of motion.     Comments: No signs of synovitis.  Lymphadenopathy:     Cervical: No cervical adenopathy.     Upper Body:     Right upper body: No supraclavicular adenopathy.     Left upper body: No supraclavicular adenopathy.  Skin:    General: Skin is warm.     Findings: No erythema.  Neurological:     General: No focal deficit present.     Mental Status: He is alert and oriented to person, place, and time.     Cranial Nerves: No cranial nerve deficit.     Sensory: No sensory deficit.     Gait: Gait normal.     Deep Tendon Reflexes:     Reflex Scores:      Bicep reflexes are 2+ on the right side and 2+ on the left side.  Patellar reflexes are 2+ on the right side and 2+ on the left side. Psychiatric:        Mood and Affect: Mood and affect normal.   ASSESSMENT AND PLAN:  [name] was seen today for annual exam and follow-up.  Diagnoses and all orders for this visit:  Orders Placed This Encounter  Procedures   Hemoglobin A1c   Comprehensive metabolic panel   Lipid panel   PSA   Lab Results  Component Value Date   PSA 0.36 05/16/2023    PSA 0.35 05/14/2022   Lab Results  Component Value Date   HGBA1C 6.5 05/16/2023   Lab Results  Component Value Date   NA 139 05/16/2023   CL 100 05/16/2023   K 4.0 05/16/2023   CO2 28 05/16/2023   BUN 24 (H) 05/16/2023   CREATININE 1.16 05/16/2023   GFR 69.00 05/16/2023   CALCIUM 9.6 05/16/2023   ALBUMIN 4.5 05/16/2023   GLUCOSE 123 (H) 05/16/2023   Lab Results  Component Value Date   ALT 41 05/16/2023   AST 91 (H) 05/16/2023   ALKPHOS 63 05/16/2023   BILITOT 1.4 (H) 05/16/2023   Lab Results  Component Value Date   CHOL 133 05/16/2023   HDL 39.80 05/16/2023   LDLCALC 66 05/16/2023   LDLDIRECT 64.0 05/14/2022   TRIG 136.0 05/16/2023   CHOLHDL 3 05/16/2023   Routine general medical examination at a health care facility Assessment & Plan: We discussed the importance of regular physical activity and healthy diet for prevention of chronic illness and/or complications. Preventive guidelines reviewed. Vaccination up-to-date. Next CPE in a year.   Type 2 diabetes mellitus with other specified complication, without long-term current use of insulin (HCC) Assessment & Plan: Last hemoglobin A1c at goal, 6.5 in 12/2022. Currently on [place]. He has an appt with his endocrinologist next week. HgA1C ordered today.  Orders: -     Hemoglobin A1c; Future  Hyperlipidemia associated with type 2 diabetes mellitus (HCC) Assessment & Plan: Last LDL 63 in 12/2022. He would like lipid panel to be repeated today. Continue atorvastatin 40 mg daily.  Orders: -     Comprehensive metabolic panel; Future -     Lipid panel; Future  Prostate cancer screening -     PSA; Future  RLS (restless legs syndrome) Assessment & Plan: Problem is not well controlled. Ferritin normal in 04/2022. He agrees with increasing dose of ropinirole from 1 mg to 2.5 mg at bedtime and can increase dose to 5 mg in 3 weeks if problem still not well-controlled. If symptoms are persisting,  neuro referral will be considered.  Orders: -     rOPINIRole HCl; Take 1 tablet (5 mg total) by mouth at bedtime.  Dispense: 90 tablet; Refill: 0  Polyneuropathy associated with underlying disease (HCC) Assessment & Plan: Still having symptoms. Continue good foot care. Continue Lyrica 200 mg daily at bedtime, could try bid. Follows with endocrinologist.   Morbid obesity (HCC) Assessment & Plan: BMI 36 with OSA,HTN,CAD,HLD.  He understands the benefits of wt loss as well as adverse effects of obesity. Consistency with healthy diet and physical activity encouraged.   Benign essential hypertension Assessment & Plan: Initially mildly elevated. Continue lisinopril 20 mg daily, HCTZ 25 mg daily, and amlodipine 5 mg daily. Continue low-salt diet. Monitor BP's at home.   Return in about 1 year (around 05/15/2024) for CPE.  Charlee Squibb G. [place], MD  [facility]. [place] office.

## 2023-05-16 ENCOUNTER — Encounter: Payer: Self-pay | Admitting: Family Medicine

## 2023-05-16 ENCOUNTER — Ambulatory Visit (INDEPENDENT_AMBULATORY_CARE_PROVIDER_SITE_OTHER): Payer: 59 | Admitting: Family Medicine

## 2023-05-16 VITALS — BP 120/80 | HR 60 | Temp 97.9°F | Resp 16 | Ht 75.6 in | Wt 300.4 lb

## 2023-05-16 DIAGNOSIS — G63 Polyneuropathy in diseases classified elsewhere: Secondary | ICD-10-CM | POA: Diagnosis not present

## 2023-05-16 DIAGNOSIS — Z125 Encounter for screening for malignant neoplasm of prostate: Secondary | ICD-10-CM | POA: Diagnosis not present

## 2023-05-16 DIAGNOSIS — R7989 Other specified abnormal findings of blood chemistry: Secondary | ICD-10-CM

## 2023-05-16 DIAGNOSIS — E1169 Type 2 diabetes mellitus with other specified complication: Secondary | ICD-10-CM | POA: Diagnosis not present

## 2023-05-16 DIAGNOSIS — E785 Hyperlipidemia, unspecified: Secondary | ICD-10-CM | POA: Diagnosis not present

## 2023-05-16 DIAGNOSIS — G2581 Restless legs syndrome: Secondary | ICD-10-CM

## 2023-05-16 DIAGNOSIS — Z7984 Long term (current) use of oral hypoglycemic drugs: Secondary | ICD-10-CM | POA: Diagnosis not present

## 2023-05-16 DIAGNOSIS — I10 Essential (primary) hypertension: Secondary | ICD-10-CM

## 2023-05-16 DIAGNOSIS — Z0000 Encounter for general adult medical examination without abnormal findings: Secondary | ICD-10-CM | POA: Diagnosis not present

## 2023-05-16 LAB — COMPREHENSIVE METABOLIC PANEL
ALT: 41 U/L (ref 0–53)
AST: 91 U/L — ABNORMAL HIGH (ref 0–37)
Albumin: 4.5 g/dL (ref 3.5–5.2)
Alkaline Phosphatase: 63 U/L (ref 39–117)
BUN: 24 mg/dL — ABNORMAL HIGH (ref 6–23)
CO2: 28 mEq/L (ref 19–32)
Calcium: 9.6 mg/dL (ref 8.4–10.5)
Chloride: 100 mEq/L (ref 96–112)
Creatinine, Ser: 1.16 mg/dL (ref 0.40–1.50)
GFR: 69 mL/min (ref >60.00)
Glucose, Bld: 123 mg/dL — ABNORMAL HIGH (ref 70–99)
Potassium: 4 mEq/L (ref 3.5–5.1)
Sodium: 139 mEq/L (ref 135–145)
Total Bilirubin: 1.4 mg/dL — ABNORMAL HIGH (ref 0.2–1.2)
Total Protein: 6.9 g/dL (ref 6.0–8.3)

## 2023-05-16 LAB — HEMOGLOBIN A1C: Hgb A1c MFr Bld: 6.5 % (ref 4.6–6.5)

## 2023-05-16 LAB — PSA: PSA: 0.36 ng/mL (ref 0.10–4.00)

## 2023-05-16 LAB — LIPID PANEL
Cholesterol: 133 mg/dL (ref 0–200)
HDL: 39.8 mg/dL (ref >39.00)
LDL Cholesterol: 66 mg/dL (ref 0–99)
NonHDL: 93.34
Total CHOL/HDL Ratio: 3
Triglycerides: 136 mg/dL (ref 0.0–149.0)
VLDL: 27.2 mg/dL (ref 0.0–40.0)

## 2023-05-16 MED ORDER — ROPINIROLE HCL 5 MG PO TABS
5.0000 mg | ORAL_TABLET | Freq: Every day | ORAL | 0 refills | Status: DC
Start: 2023-05-16 — End: 2023-08-11

## 2023-05-16 NOTE — Patient Instructions (Addendum)
A few things to remember from today's visit:  Routine general medical examination at a health care facility  Type 2 diabetes mellitus with other specified complication, without long-term current use of insulin (HCC) - Plan: Hemoglobin A1c  Hyperlipidemia associated with type 2 diabetes mellitus (HCC) - Plan: Comprehensive metabolic panel, Lipid panel  Prostate cancer screening - Plan: PSA  RLS (restless legs syndrome) - Plan: ropinirole (REQUIP) 5 MG tablet  Ropinirole increased today, try 1/2 tab at bedtime and increase to 1 tab in 2 weeks if needed. If not better in 2-3 months, we can consider appt with neurologist.  If you need refills for medications you take chronically, please call your pharmacy. Do not use My Chart to request refills or for acute issues that need immediate attention. If you send a my chart message, it may take a few days to be addressed, specially if I am not in the office.  Please be sure medication list is accurate. If a new problem present, please set up appointment sooner than planned today.  Health Maintenance, Male Adopting a healthy lifestyle and getting preventive care are important in promoting health and wellness. Ask your health care provider about: The right schedule for you to have regular tests and exams. Things you can do on your own to prevent diseases and keep yourself healthy. What should I know about diet, weight, and exercise? Eat a healthy diet  Eat a diet that includes plenty of vegetables, fruits, low-fat dairy products, and lean protein. Do not eat a lot of foods that are high in solid fats, added sugars, or sodium. Maintain a healthy weight Body mass index (BMI) is a measurement that can be used to identify possible weight problems. It estimates body fat based on height and weight. Your health care provider can help determine your BMI and help you achieve or maintain a healthy weight. Get regular exercise Get regular exercise. This is  one of the most important things you can do for your health. Most adults should: Exercise for at least 150 minutes each week. The exercise should increase your heart rate and make you sweat (moderate-intensity exercise). Do strengthening exercises at least twice a week. This is in addition to the moderate-intensity exercise. Spend less time sitting. Even light physical activity can be beneficial. Watch cholesterol and blood lipids Have your blood tested for lipids and cholesterol at 59 years of age, then have this test every 5 years. You may need to have your cholesterol levels checked more often if: Your lipid or cholesterol levels are high. You are older than 59 years of age. You are at high risk for heart disease. What should I know about cancer screening? Many types of cancers can be detected early and may often be prevented. Depending on your health history and family history, you may need to have cancer screening at various ages. This may include screening for: Colorectal cancer. Prostate cancer. Skin cancer. Lung cancer. What should I know about heart disease, diabetes, and high blood pressure? Blood pressure and heart disease High blood pressure causes heart disease and increases the risk of stroke. This is more likely to develop in people who have high blood pressure readings or are overweight. Talk with your health care provider about your target blood pressure readings. Have your blood pressure checked: Every 3-5 years if you are 40-82 years of age. Every year if you are 95 years old or older. If you are between the ages of 24 and 48 and are  a current or former smoker, ask your health care provider if you should have a one-time screening for abdominal aortic aneurysm (AAA). Diabetes Have regular diabetes screenings. This checks your fasting blood sugar level. Have the screening done: Once every three years after age 37 if you are at a normal weight and have a low risk for  diabetes. More often and at a younger age if you are overweight or have a high risk for diabetes. What should I know about preventing infection? Hepatitis B If you have a higher risk for hepatitis B, you should be screened for this virus. Talk with your health care provider to find out if you are at risk for hepatitis B infection. Hepatitis C Blood testing is recommended for: Everyone born from [birth year] through 1967. Anyone with known risk factors for hepatitis C. Sexually transmitted infections (STIs) You should be screened each year for STIs, including gonorrhea and chlamydia, if: You are sexually active and are younger than 59 years of age. You are older than 59 years of age and your health care provider tells you that you are at risk for this type of infection. Your sexual activity has changed since you were last screened, and you are at increased risk for chlamydia or gonorrhea. Ask your health care provider if you are at risk. Ask your health care provider about whether you are at high risk for HIV. Your health care provider may recommend a prescription medicine to help prevent HIV infection. If you choose to take medicine to prevent HIV, you should first get tested for HIV. You should then be tested every 3 months for as long as you are taking the medicine. Follow these instructions at home: Alcohol use Do not drink alcohol if your health care provider tells you not to drink. If you drink alcohol: Limit how much you have to 0-2 drinks a day. Know how much alcohol is in your drink. In the [place] one drink equals one 12 oz bottle of beer (355 mL), one 5 oz glass of wine (148 mL), or one 1 oz glass of hard liquor (44 mL). Lifestyle Do not use any products that contain nicotine or tobacco. These products include cigarettes, chewing tobacco, and vaping devices, such as e-cigarettes. If you need help quitting, ask your health care provider. Do not use street drugs. Do not share needles. Ask  your health care provider for help if you need support or information about quitting drugs. General instructions Schedule regular health, dental, and eye exams. Stay current with your vaccines. Tell your health care provider if: You often feel depressed. You have ever been abused or do not feel safe at home. Summary Adopting a healthy lifestyle and getting preventive care are important in promoting health and wellness. Follow your health care provider's instructions about healthy diet, exercising, and getting tested or screened for diseases. Follow your health care provider's instructions on monitoring your cholesterol and blood pressure. This information is not intended to replace advice given to you by your health care provider. Make sure you discuss any questions you have with your health care provider. Document Revised: 01/29/2021 Document Reviewed: 01/29/2021 Elsevier Patient Education  2024 [employer].

## 2023-05-17 NOTE — Assessment & Plan Note (Signed)
We discussed the importance of regular physical activity and healthy diet for prevention of chronic illness and/or complications. Preventive guidelines reviewed. Vaccination up to date. Next CPE in a year. 

## 2023-05-17 NOTE — Assessment & Plan Note (Signed)
BMI 36 with OSA,HTN,CAD,HLD.  He understands the benefits of wt loss as well as adverse effects of obesity. Consistency with healthy diet and physical activity encouraged.

## 2023-05-17 NOTE — Assessment & Plan Note (Addendum)
Initially mildly elevated. Continue lisinopril 20 mg daily, HCTZ 25 mg daily, and amlodipine 5 mg daily. Continue low-salt diet. Monitor BP's at home.

## 2023-05-17 NOTE — Assessment & Plan Note (Signed)
Problem is not well controlled. Ferritin normal in 04/2022. He agrees with increasing dose of ropinirole from 1 mg to 2.5 mg at bedtime and can increase dose to 5 mg in 3 weeks if problem still not well-controlled. If symptoms are persisting, neuro referral will be considered.

## 2023-05-17 NOTE — Assessment & Plan Note (Addendum)
Still having symptoms. Continue good foot care. Continue Lyrica 200 mg daily at bedtime, could try bid. Follows with endocrinologist.

## 2023-05-17 NOTE — Assessment & Plan Note (Signed)
Last LDL 63 in 12/2022. He would like lipid panel to be repeated today. Continue atorvastatin 40 mg daily.

## 2023-05-17 NOTE — Assessment & Plan Note (Signed)
Last hemoglobin A1c at goal, 6.5 in 12/2022. Currently on [place]. He has an appt with his endocrinologist next week. HgA1C ordered today.

## 2023-05-21 ENCOUNTER — Ambulatory Visit
Admission: RE | Admit: 2023-05-21 | Discharge: 2023-05-21 | Disposition: A | Discharge status: Home / Self Care | Payer: 59 | Source: Ambulatory Visit | Attending: Family Medicine | Admitting: Family Medicine

## 2023-05-21 DIAGNOSIS — R7989 Other specified abnormal findings of blood chemistry: Secondary | ICD-10-CM

## 2023-07-23 ENCOUNTER — Encounter: Payer: Self-pay | Admitting: Internal Medicine

## 2023-07-23 ENCOUNTER — Ambulatory Visit: Payer: 59 | Admitting: Internal Medicine

## 2023-07-23 VITALS — BP 120/80 | HR 70 | Temp 97.8°F | Wt 302.9 lb

## 2023-07-23 DIAGNOSIS — H029 Unspecified disorder of eyelid: Secondary | ICD-10-CM

## 2023-07-23 NOTE — Progress Notes (Signed)
Established Patient Office Visit     CC/Reason for Visit: Lesion on inner corner of right eye  HPI: Wayne Larsen is a 57 y.o. male who is coming in today for the above mentioned reasons.  He is getting ready to have a deviated septum repair and as part of that saw his ENT yesterday who commented on a lesion on the inner corner of his right eye that he believes is cancerous.  He comes in today requesting referral.  He first noticed the lesion about 10 days ago.   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 He has never used smokeless tobacco. He reports that he does not currently use alcohol. He reports that he does not use drugs.  Allergies: No Known Allergies  Family History:  Family History  Problem Relation Age of Onset   Cancer Mother    Early death Mother    Arthritis Father    Alcohol abuse Father    Heart disease Father    Hyperlipidemia Father    Hypertension Father    Crohn's disease Neg Hx    Colon polyps Neg Hx    Colon cancer Neg Hx    Esophageal cancer Neg Hx    Stomach cancer Neg Hx    Rectal cancer Neg Hx      Current Outpatient Medications:    amLODipine (NORVASC) 5 MG tablet, TAKE 1 TABLET (5 MG TOTAL) BY MOUTH DAILY., Disp: 90 tablet, Rfl: 3   atorvastatin (LIPITOR) 40 MG tablet, TAKE 1 TABLET BY  MOUTH EVERYDAY AT BEDTIME, Disp: 90 tablet, Rfl: 3   FARXIGA 10 MG TABS tablet, Take 10 mg by mouth daily., Disp: , Rfl:    hydrochlorothiazide (HYDRODIURIL) 25 MG tablet, TAKE 1 TABLET (25 MG TOTAL) BY MOUTH DAILY., Disp: 90 tablet, Rfl: 3   lisinopril (ZESTRIL) 20 MG tablet, TAKE 1 TABLET BY MOUTH EVERY DAY, Disp: 90 tablet, Rfl: 1   MOUNJARO 5 MG/0.5ML Pen, SMARTSIG:5 Milligram(s) SUB-Q Once a Week, Disp: , Rfl:    potassium chloride (KLOR-CON) 10 MEQ tablet, TAKE 1 TABLET BY MOUTH EVERY DAY, Disp: 90 tablet, Rfl: 3   pregabalin (LYRICA) 200 MG capsule, Take 200 mg by mouth daily., Disp: , Rfl:    ropinirole (REQUIP) 5 MG tablet, Take 1 tablet (5 mg total) by mouth at bedtime., Disp: 90 tablet, Rfl: 0   zolpidem (AMBIEN) 10 MG tablet, Take 1 tablet (10 mg total) by mouth at bedtime as needed for sleep., Disp: 30 tablet, Rfl: 2  Current Facility-Administered Medications:    0.9 %  sodium chloride infusion, 500 mL, Intravenous, Once, Norwood Levo  C, MD  Review of Systems:  Negative unless indicated in HPI.   Physical Exam: Vitals:   07/23/23 1419  BP: 120/80  Pulse: 70  Temp: 97.8 F (36.6 C)  TempSrc: Oral  SpO2: 98%  Weight: (!) 302 lb 14.4 oz (137.4 kg)    Body mass index is 37.26 kg/m.   Physical Exam Eyes:     Comments: Ulcerated, erythematous lesion of the inner corner of his right eye.      Impression and Plan:  Lesion of right upper eyelid -     Ambulatory referral to Dermatology  -Urgent referral to dermatology for evaluation   Time spent:21 minutes reviewing chart, interviewing and examining patient and formulating plan of care.     Chaya Jan, MD [facility]

## 2023-08-10 ENCOUNTER — Other Ambulatory Visit: Payer: Self-pay | Admitting: Family Medicine

## 2023-08-10 DIAGNOSIS — G2581 Restless legs syndrome: Secondary | ICD-10-CM

## 2023-08-27 ENCOUNTER — Other Ambulatory Visit: Payer: Self-pay | Admitting: Family Medicine

## 2023-08-27 DIAGNOSIS — I10 Essential (primary) hypertension: Secondary | ICD-10-CM

## 2023-11-08 ENCOUNTER — Other Ambulatory Visit: Payer: Self-pay | Admitting: Cardiovascular Disease

## 2023-11-14 ENCOUNTER — Encounter: Payer: 59 | Admitting: Internal Medicine

## 2023-11-22 ENCOUNTER — Other Ambulatory Visit: Payer: Self-pay | Admitting: Cardiovascular Disease

## 2023-11-22 DIAGNOSIS — E1169 Type 2 diabetes mellitus with other specified complication: Secondary | ICD-10-CM

## 2023-12-02 ENCOUNTER — Other Ambulatory Visit: Payer: Self-pay | Admitting: Cardiovascular Disease

## 2023-12-04 NOTE — Progress Notes (Unsigned)
 Cardiology Office Note:    Date:  12/05/2023   ID:  Wayne Larsen, DOB 05-06-1966, MRN [id number]  PCP:  [place], Betty G, MD  Cardiologist:  Kristeen Miss, MD  Electrophysiologist:  None   Referring MD: [place], Betty G, MD   Chief Complaint  Patient presents with   Hypertension            Wayne Larsen is a 58 y.o. male with a hx of hypertension, hyperlipidemia.  He recently moved from [place] and is here to establish care.  We were asked to see him for his HTN and hyperlipidemia by Dr. [place] .   Hx of renal cell cancer - s/p right nephrectomy    Originally from [place] .  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  Has been told that had a MI years ago.  Has not had cath or stenting .   Fairly active now.   Ple[place] quite a bit .  [place] .   Trying to start an exercise program .  Works 16 hour days .   No CP , works in yard without any issues.  Eats quite a bit out on the road.  And when he is at home he cooks foods that are typically preprepared.  November 03, 2019:  BP is much better ,  Edema is much better after reducing the amlodipine.  Is on  We added HCTZ and kdur  BP is much better  Wt is 335 lbs.  ( down 7 lbs from previous visit in Nov. 2021)   He is feeling much better.  He admits that he still eats too much.  Nov. 30, 2021: Seen today for follow up of HTN and HLD  Wt. Is 337 lbs  ( up 2 lbs from previous )   Lipid profile from March 22, 2020 looks good.  His total cholesterol is 109.  HDL is 39.  LDL is 45.  Triglyceride levels 123.  Does not work for [employer] any longer , had to travel too much , is currently between jobs.   No leg swelling  No chest pain  Is exercising regularly    Nov. 28, 2022 Wayne Larsen is seen today for follow up of his  HTN and HLD  Wt is 316 ( down 21 lbs from previous exam)  Has limited his diet. Eating much better.  Has been having some chest pain Especially in the evening Has been walking / jogging almost daily  Has some upper abdominal / lower chest pain with running  Also has some CP in the evenings   November 26, 2021; Wayne Larsen is seen for follow up of his HTN and HLD Wt is 316  ( former Magazine features editor )  He had been having some chest pain   Cor CTA showed Coronary calcium score of 73. This was 20 percentile for age and sex matched control. AV - Bicuspid AV   ( echo appears to show a 3 leaflet valve)  RCA : mild prox plaque LM: normal LAD :  mild prox plaque LCx:  non dom, large OM1,  small area of minimal plaque  Echo shows normal LV function,  grade 1 DD, trivial AI Asc. Aorta is borderline dilated  The AV appears to have 3  leaflets on echo  He does not have a significant murmur to suggest aortic valve pathology. I suspect the bileaflet AV comment on the CTA is a typo.   Tore him hamstring while jogging  I recommended  cycling   Lipids were reviewed - LDL is 35   March 8 , 2024   Wayne Larsen is seen for follow up of his HTN, mild CAD  Cor CTA suggests a bicuspid AV Echo shows 3 leaflets Wt is 301.8 Has been exercising more,  watching his diet  Feels better  No cp  , no dyspnea  Is jogging regularly   Is going to Dr. Jeral Pinch ( ENT at [facility])  Will need nasal  septal surgery   He is at low risk for his nasal septal surgery . He will not need any medication changes from my standpoint     December 05, 2023 Wayne Larsen is seen for follow up of his HTN, mild CAD, obsity  Possible bicuspid AV   ECG shows possible old Inf MI He recalls having lots of pain for a weekend about 8-9 years ago  Cor CTA recently did not show any significant CAD    Runs ~ 3 miles a day .  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    Past Medical History:   Diagnosis Date   Blood in stool    history of blood in stool   Cancer (HCC) 2008   kidney-R   Chronic kidney disease    Depression    Diabetes mellitus without complication (HCC)    on meds   Heart murmur    History of colon polyps    Hypertension    Myocardial infarction (HCC)    hx of   Seasonal allergies    Sleep apnea    uses CPAP    Past Surgical History:  Procedure Laterality Date   BACK SURGERY  1989   and 1990   [place]   LEG AMPUTATION THROUGH LOWER TIBIA AND FIBULA Left 2012   NEPHRECTOMY Right 2008   NOSE SURGERY  2015    Current Medications: Current Meds  Medication Sig   FARXIGA 10 MG TABS tablet Take 10 mg by mouth daily.   MOUNJARO 10 MG/0.5ML Pen Inject 10 mg into the skin once a week.   pregabalin (LYRICA) 200 MG capsule Take 200 mg by mouth daily.   zolpidem (AMBIEN) 10 MG tablet Take 1 tablet (10 mg total) by mouth at bedtime as needed for sleep.   [DISCONTINUED] amLODipine (NORVASC) 5 MG tablet TAKE 1 TABLET (5 MG TOTAL) BY MOUTH DAILY.   [DISCONTINUED] atorvastatin (LIPITOR) 40 MG tablet TAKE 1 TABLET BY MOUTH EVERYDAY AT BEDTIME   [DISCONTINUED] hydrochlorothiazide (HYDRODIURIL) 25 MG tablet TAKE 1 TABLET (25 MG TOTAL) BY MOUTH DAILY.   [DISCONTINUED] lisinopril (ZESTRIL) 20 MG tablet TAKE 1 TABLET BY MOUTH EVERY DAY   [DISCONTINUED] potassium chloride (KLOR-CON) 10 MEQ tablet Take 1 tablet (10 mEq total) by mouth daily. Pt needs to keep upcoming appt in March for further refills - final attempt   Current Facility-Administered Medications for the 12/05/23 encounter (Office Visit) with Donella Pascarella, Deloris Ping, MD  Medication   0.9 %  sodium chloride infusion     Allergies:   Patient has no known allergies.   Social History   Socioeconomic History   Marital status: Married    Spouse name: Not  on file   Number of children: 2   Years of education: Not on file   Highest education level: Not on file  Occupational  History   Not on file  Tobacco Use   Smoking status: Never   Smokeless tobacco: Never  Vaping Use   Vaping status: Never Used  Substance and Sexual Activity   Alcohol use: Not Currently    Alcohol/week: 0.0 - 2.0 standard drinks of alcohol   Drug use: Never   Sexual activity: Yes  Other Topics Concern   Not on file  Social History Narrative   Not on file   Social Drivers of Health   Financial Resource Strain: Not on file  Food Insecurity: Low Risk  (07/22/2023)   Received from [facility]   Hunger Vital Sign    Worried About Running Out of Food in the Last Year: Never true    Ran Out of Food in the Last Year: Never true  Transportation Needs: No Transportation Needs (07/22/2023)   Received from [facility]    In the past 12 months, has lack of reliable transportation kept you from medical appointments, meetings, work or from getting things needed for daily living? : No  Physical Activity: Not on file  Stress: Not on file  Social Connections: Not on file     Family History: The patient's family history includes Alcohol abuse in his father; Arthritis in his father; Cancer in his mother; Early death in his mother; Heart disease in his father; Hyperlipidemia in his father; Hypertension in his father. There is no history of Crohn's disease, Colon polyps, Colon cancer, Esophageal cancer, Stomach cancer, or Rectal cancer.  ROS:   Please see the history of present illness.     All other systems reviewed and are negative.  EKGs/Labs/Other Studies Reviewed:    The following studies were reviewed today:   EKG:       EKG Interpretation Date/Time:  Friday December 05 2023 10:07:23 EDT Ventricular Rate:  69 PR Interval:  188 QRS Duration:  96 QT Interval:  388 QTC Calculation: 415 R Axis:   18  Text Interpretation: Normal sinus rhythm Cannot rule out Inferior infarct , age undetermined No previous ECGs available Confirmed by Kristeen Miss [phone]) on  12/05/2023 10:22:32 AM      Recent Labs: 05/16/2023: ALT 41; BUN 24; Creatinine, Ser 1.16; Potassium 4.0; Sodium 139  Recent Lipid Panel    Component Value Date/Time   CHOL 133 05/16/2023 1010   TRIG 136.0 05/16/2023 1010   HDL 39.80 05/16/2023 1010   CHOLHDL 3 05/16/2023 1010   VLDL 27.2 05/16/2023 1010   LDLCALC 66 05/16/2023 1010   LDLDIRECT 64.0 05/14/2022 1110    Physical Exam:     Physical Exam: Blood pressure 122/68, pulse 69, height 6\' 3"  (1.905 m), weight 291 lb (132 kg), SpO2 99%.       GEN:  large , middle age man.  in no acute distress HEENT: Normal NECK: No JVD; No carotid bruits LYMPHATICS: No lymphadenopathy CARDIAC: RRR , no murmurs, rubs, gallops RESPIRATORY:  Clear to auscultation without rales, wheezing or rhonchi  ABDOMEN: Soft, non-tender, non-distended MUSCULOSKELETAL:  No edema; No deformity  SKIN: Warm and dry NEUROLOGIC:  Alert and oriented x 3     ASSESSMENT:    1. Hyperlipidemia associated with type 2 diabetes mellitus (HCC)   2. Benign essential hypertension   3. 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   Exertional chest pain.      He is no longer having any episodes of chest pain.  He has been running 3 miles a day most days of the week.  He is not having any having any issues.  Has had a coronary CTA which revealed nonobstructive coronary artery disease.  He does recall having an episode perhaps 9 years ago when he had a bout of chest discomfort that lasted a weekend.  It does not sound like he was seen during that episode.   2.   Essential hypertension:     Blood pressure is well-controlled.  As he continues to exercise and lose weight I suspect that his blood pressure will go down and he may not need all of these blood pressure medications.  I would probably discontinue amlodipine first.      3.   Hyperlipidemia: Lipids look great.  Continue  current medications.        Medication Adjustments/Labs and Tests Ordered: Current medicines are reviewed at length with the patient today.  Concerns regarding medicines are outlined above.  Orders Placed This Encounter  Procedures   EKG 12-Lead    Meds ordered this encounter  Medications   amLODipine (NORVASC) 5 MG tablet    Sig: Take 1 tablet (5 mg total) by mouth daily.    Dispense:  90 tablet    Refill:  3   lisinopril (ZESTRIL) 20 MG tablet    Sig: Take 1 tablet (20 mg total) by mouth daily.    Dispense:  90 tablet    Refill:  3   potassium chloride (KLOR-CON) 10 MEQ tablet    Sig: Take 1 tablet (10 mEq total) by mouth daily.    Dispense:  90 tablet    Refill:  3   hydrochlorothiazide (HYDRODIURIL) 25 MG tablet    Sig: Take 1 tablet (25 mg total) by mouth daily.    Dispense:  90 tablet    Refill:  3   atorvastatin (LIPITOR) 40 MG tablet    Sig: TAKE 1 TABLET BY MOUTH EVERYDAY AT BEDTIME    Dispense:  90 tablet    Refill:  3        Patient Instructions  Medication Instructions:  REFILLED Medications *If you need a refill on your cardiac medications before your next appointment, please call your pharmacy*  Follow-Up: At [facility], you and your health needs are our priority.  As part of our continuing mission to provide you with exceptional heart care, we have created designated Provider Care Teams.  These Care Teams include your primary Cardiologist (physician) and Advanced Practice Providers (APPs -  Physician Assistants and Nurse Practitioners) who all work together to provide you with the care you need, when you need it.  Your next appointment:   1 year(s)  Provider:   Lu Duffel   1st Floor: - Lobby - Registration  - Pharmacy  - Lab - Cafe  2nd Floor: - PV Lab - Diagnostic Testing (echo, CT, nuclear med)  3rd Floor: - Vacant  4th Floor: - TCTS (cardiothoracic surgery) - AFib Clinic - Structural Heart Clinic - Vascular  Surgery  - Vascular Ultrasound  5th Floor: - HeartCare Cardiology (general and EP) - Clinical Pharmacy for coumadin, hypertension, lipid, weight-loss medications, and med management appointments    Valet parking services will be available as well.     Signed, Kristeen Miss,  MD  12/05/2023 10:32 AM    [facility]

## 2023-12-05 ENCOUNTER — Ambulatory Visit: Payer: 59 | Attending: Cardiology | Admitting: Cardiovascular Disease

## 2023-12-05 ENCOUNTER — Encounter: Payer: Self-pay | Admitting: Cardiovascular Disease

## 2023-12-05 VITALS — BP 122/68 | HR 69 | Ht 75.0 in | Wt 291.0 lb

## 2023-12-05 DIAGNOSIS — I10 Essential (primary) hypertension: Secondary | ICD-10-CM

## 2023-12-05 DIAGNOSIS — E1169 Type 2 diabetes mellitus with other specified complication: Secondary | ICD-10-CM | POA: Diagnosis not present

## 2023-12-05 DIAGNOSIS — E785 Hyperlipidemia, unspecified: Secondary | ICD-10-CM

## 2023-12-05 DIAGNOSIS — I2510 Atherosclerotic heart disease of native coronary artery without angina pectoris: Secondary | ICD-10-CM | POA: Diagnosis not present

## 2023-12-05 MED ORDER — LISINOPRIL 20 MG PO TABS: 20.0000 mg | ORAL_TABLET | Freq: Every day | ORAL | 3 refills | Status: AC

## 2023-12-05 MED ORDER — AMLODIPINE BESYLATE 5 MG PO TABS: 5.0000 mg | ORAL_TABLET | Freq: Every day | ORAL | 3 refills | Status: AC

## 2023-12-05 MED ORDER — HYDROCHLOROTHIAZIDE 25 MG PO TABS: 25.0000 mg | ORAL_TABLET | Freq: Every day | ORAL | 3 refills | Status: AC

## 2023-12-05 MED ORDER — ATORVASTATIN CALCIUM 40 MG PO TABS: ORAL_TABLET | ORAL | 3 refills | Status: AC

## 2023-12-05 MED ORDER — POTASSIUM CHLORIDE ER 10 MEQ PO TBCR: 10.0000 meq | EXTENDED_RELEASE_TABLET | Freq: Every day | ORAL | 3 refills | Status: AC

## 2023-12-05 NOTE — Patient Instructions (Signed)
 Medication Instructions:  REFILLED Medications *If you need a refill on your cardiac medications before your next appointment, please call your pharmacy*  Follow-Up: At [facility], you and your health needs are our priority.  As part of our continuing mission to provide you with exceptional heart care, we have created designated Provider Care Teams.  These Care Teams include your primary Cardiologist (physician) and Advanced Practice Providers (APPs -  Physician Assistants and Nurse Practitioners) who all work together to provide you with the care you need, when you need it.  Your next appointment:   1 year(s)  Provider:   Lu Duffel   1st Floor: - Lobby - Registration  - Pharmacy  - Lab - Cafe  2nd Floor: - PV Lab - Diagnostic Testing (echo, CT, nuclear med)  3rd Floor: - Vacant  4th Floor: - TCTS (cardiothoracic surgery) - AFib Clinic - Structural Heart Clinic - Vascular Surgery  - Vascular Ultrasound  5th Floor: - HeartCare Cardiology (general and EP) - Clinical Pharmacy for coumadin, hypertension, lipid, weight-loss medications, and med management appointments    Valet parking services will be available as well.

## 2023-12-31 NOTE — Progress Notes (Signed)
 ACUTE VISIT No chief complaint on file.  HPI: Mr.Wayne Larsen is a 58 y.o. male with a PMHx significant for DM II, HLD, OSA, CAD, RBBB, and peripheral neuropathy, who is here today complaining of a lump on his lower back.   Review of Systems See other pertinent positives and negatives in HPI.  Current Outpatient Medications on File Prior to Visit  Medication Sig Dispense Refill   amLODipine (NORVASC) 5 MG tablet Take 1 tablet (5 mg total) by mouth daily. 90 tablet 3   atorvastatin (LIPITOR) 40 MG tablet TAKE 1 TABLET BY MOUTH EVERYDAY AT BEDTIME 90 tablet 3   FARXIGA 10 MG TABS tablet Take 10 mg by mouth daily.     hydrochlorothiazide (HYDRODIURIL) 25 MG tablet Take 1 tablet (25 mg total) by mouth daily. 90 tablet 3   lisinopril (ZESTRIL) 20 MG tablet Take 1 tablet (20 mg total) by mouth daily. 90 tablet 3   MOUNJARO 10 MG/0.5ML Pen Inject 10 mg into the skin once a week.     potassium chloride (KLOR-CON) 10 MEQ tablet Take 1 tablet (10 mEq total) by mouth daily. 90 tablet 3   pregabalin (LYRICA) 200 MG capsule Take 200 mg by mouth daily.     zolpidem (AMBIEN) 10 MG tablet Take 1 tablet (10 mg total) by mouth at bedtime as needed for sleep. 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   Chronic kidney disease    Depression    Diabetes mellitus without complication (HCC)    on meds   Heart murmur    History of colon polyps    Hypertension    Myocardial infarction (HCC)    hx of   Seasonal allergies    Sleep apnea    uses CPAP   No Known Allergies  Social History   Socioeconomic History   Marital status: Married    Spouse name: Not on file   Number of children: 2    Years of education: Not on file   Highest education level: Not on file  Occupational History   Not on file  Tobacco Use   Smoking status: Never   Smokeless tobacco: Never  Vaping Use   Vaping status: Never Used  Substance and Sexual Activity   Alcohol use: Not Currently    Alcohol/week: 0.0 - 2.0 standard drinks of alcohol   Drug use: Never   Sexual activity: Yes  Other Topics Concern   Not on file  Social History Narrative   Not on file   Social Drivers of Health   Financial Resource Strain: Not on file  Food Insecurity: Low Risk  (07/22/2023)   Received from [facility]   Hunger Vital Sign    Worried About Running Out of Food in the Last Year: Never true    Ran Out of Food in the Last Year: Never true  Transportation Needs: No Transportation Needs (07/22/2023)   Received from [facility]    In the past 12 months, has lack of reliable transportation kept you from medical appointments, meetings, work or from getting things needed for daily  living? : No  Physical Activity: Not on file  Stress: Not on file  Social Connections: Not on file    There were no vitals filed for this visit. There is no height or weight on file to calculate BMI.  Physical Exam  ASSESSMENT AND PLAN:  [name] was seen today for a lump on his lower back.   There are no diagnoses linked to this encounter.  No follow-ups on file.  I, [name], acting as a scribe for Wayne Parkerson [place], MD., have documented all relevant documentation on the behalf of Wayne Furney [place], MD, as directed by  Wayne Tonner [place], MD while in the presence of Wayne Delair [place], MD.   I, Wayne Opie [place], MD, have reviewed all documentation for this visit. The documentation on 12/31/23 for the exam, diagnosis, procedures, and orders are all accurate and complete.  Wayne Nazir G. [place], MD  [facility]. [place] office.  Discharge Instructions   None

## 2024-01-01 ENCOUNTER — Encounter: Payer: Self-pay | Admitting: Family Medicine

## 2024-01-01 ENCOUNTER — Ambulatory Visit: Admitting: Family Medicine

## 2024-01-01 VITALS — BP 126/70 | HR 80 | Resp 16 | Ht 75.0 in | Wt 293.0 lb

## 2024-01-01 DIAGNOSIS — M2550 Pain in unspecified joint: Secondary | ICD-10-CM

## 2024-01-01 DIAGNOSIS — R222 Localized swelling, mass and lump, trunk: Secondary | ICD-10-CM | POA: Diagnosis not present

## 2024-01-01 LAB — BASIC METABOLIC PANEL WITH GFR
BUN: 22 mg/dL (ref 6–23)
CO2: 29 meq/L (ref 19–32)
Calcium: 9.4 mg/dL (ref 8.4–10.5)
Chloride: 99 meq/L (ref 96–112)
Creatinine, Ser: 1.55 mg/dL — ABNORMAL HIGH (ref 0.40–1.50)
GFR: 48.52 mL/min — ABNORMAL LOW (ref >60.00)
Glucose, Bld: 124 mg/dL — ABNORMAL HIGH (ref 70–99)
Potassium: 4 meq/L (ref 3.5–5.1)
Sodium: 136 meq/L (ref 135–145)

## 2024-01-01 LAB — C-REACTIVE PROTEIN: CRP: <1 mg/dL (ref 0.5–20.0)

## 2024-01-01 LAB — URIC ACID: Uric Acid, Serum: 5.3 mg/dL (ref 4.0–7.8)

## 2024-01-01 LAB — SEDIMENTATION RATE: Sed Rate: 4 mm/h (ref 0–20)

## 2024-01-01 MED ORDER — MELOXICAM 7.5 MG PO TABS: 7.5000 mg | ORAL_TABLET | Freq: Every day | ORAL | 0 refills | Status: DC | PRN

## 2024-01-01 NOTE — Patient Instructions (Addendum)
 A few things to remember from today's visit:  Polyarthralgia - Plan: Sedimentation rate, C-reactive protein, Uric acid, Basic metabolic panel with GFR, meloxicam (MOBIC) 7.5 MG tablet  Mass of skin of back - Plan: Ambulatory referral to General Surgery  Meloxicam 1-2 tabs daily for 7-10 days then as needed.  If you need refills for medications you take chronically, please call your pharmacy. Do not use My Chart to request refills or for acute issues that need immediate attention. If you send a my chart message, it may take a few days to be addressed, specially if I am not in the office.  Please be sure medication list is accurate. If a new problem present, please set up appointment sooner than planned today.

## 2024-01-22 ENCOUNTER — Ambulatory Visit: Payer: Self-pay | Admitting: Surgery

## 2024-01-22 ENCOUNTER — Other Ambulatory Visit: Payer: Self-pay | Admitting: Surgery

## 2024-01-22 DIAGNOSIS — D171 Benign lipomatous neoplasm of skin and subcutaneous tissue of trunk: Secondary | ICD-10-CM

## 2024-02-04 ENCOUNTER — Encounter (HOSPITAL_COMMUNITY): Payer: Self-pay | Admitting: Emergency Medicine

## 2024-02-04 ENCOUNTER — Ambulatory Visit (HOSPITAL_COMMUNITY)
Admission: EM | Admit: 2024-02-04 | Discharge: 2024-02-04 | Disposition: A | Discharge status: Home / Self Care | Attending: Emergency Medicine | Admitting: Emergency Medicine

## 2024-02-04 ENCOUNTER — Other Ambulatory Visit: Payer: Self-pay

## 2024-02-04 DIAGNOSIS — M79661 Pain in right lower leg: Secondary | ICD-10-CM | POA: Diagnosis not present

## 2024-02-04 MED ORDER — NAPROXEN 500 MG PO TABS: 500.0000 mg | ORAL_TABLET | Freq: Two times a day (BID) | ORAL | 0 refills | Status: DC

## 2024-02-04 NOTE — ED Provider Notes (Signed)
 [facility]    CSN: [id number] Arrival date & time: 02/04/24  0859      History   Chief Complaint Chief Complaint  Patient presents with   Leg Pain    HPI Wayne Larsen is a 58 y.o. male.   Patient presents after injuring his right leg while at work today.  Patient states that he was lifting a head and felt a sudden pop in the back of his right lower leg.    Patient states that he has been in pain since then and reports difficulty bearing weight on his leg.  Patient states that when he bears weight on his leg he has some shooting pain down to his toes.  Patient denies any falls.   The history is provided by the patient and medical records.  Leg Pain   Past Medical History:  Diagnosis Date   Blood in stool    history of blood in stool   Cancer (HCC) 2008   kidney-R   Chronic kidney disease    Depression    Diabetes mellitus without complication (HCC)    on meds   Heart murmur    History of colon polyps    Hypertension    Myocardial infarction (HCC)    hx of   Seasonal allergies    Sleep apnea    uses CPAP    Patient Active Problem List   Diagnosis Date Noted   Routine general medical examination at a health care facility 05/16/2023   RLS (restless legs syndrome) 05/14/2022   Chest pain of uncertain etiology 08/20/2021   RBBB 08/20/2021   Polyneuropathy associated with underlying disease (HCC) 03/23/2020   Leg edema 11/03/2019   OSA treated with BiPAP 08/03/2019   Insomnia 08/03/2019   Type 2 diabetes mellitus with other specified complication (HCC) 05/14/2019   Benign essential hypertension 05/14/2019   Hyperlipidemia associated with type 2 diabetes mellitus (HCC) 05/14/2019   CAD (coronary artery disease) 05/14/2019   Peripheral neuropathy 05/14/2019    Past Surgical History:  Procedure Laterality Date   BACK SURGERY  1989   and 1990   [place]   [facility]   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 Authorizing Provider  naproxen (NAPROSYN) 500 MG tablet Take 1 tablet (500 mg total) by mouth 2 (two) times daily. 02/04/24  Yes Rosevelt Constable, Wake Conlee A, NP  amLODipine  (NORVASC ) 5 MG tablet Take 1 tablet (5 mg total) by mouth daily. 12/05/23   Nahser, Lela Purple, MD  atorvastatin  (LIPITOR) 40 MG tablet TAKE 1 TABLET BY MOUTH EVERYDAY AT BEDTIME 12/05/23   Nahser, Lela Purple, MD  FARXIGA 10 MG TABS tablet Take 10 mg by mouth daily. 03/01/19   [provider]  hydrochlorothiazide  (HYDRODIURIL ) 25 MG tablet Take 1 tablet (25 mg total) by mouth daily. 12/05/23   Nahser, Lela Purple, MD  lisinopril  (ZESTRIL ) 20 MG tablet Take 1 tablet (20 mg total) by mouth daily. 12/05/23   Nahser, Lela Purple, MD  meloxicam  (MOBIC ) 7.5 MG tablet Take 1-2 tablets (7.5-15 mg total) by mouth daily as needed for pain. 01/01/24   [place], Betty G, MD  MOUNJARO 10 MG/0.5ML Pen Inject 10 mg into the skin once a week. 10/03/23   [provider]  potassium chloride  (  KLOR-CON ) 10 MEQ tablet Take 1 tablet (10 mEq total) by mouth daily. 12/05/23   Nahser, Lela Purple, MD  pregabalin (LYRICA) 200 MG capsule Take 200 mg by mouth daily. 02/08/19   [provider]  zolpidem  (AMBIEN ) 10 MG tablet Take 1 tablet (10 mg total) by mouth at bedtime as needed for sleep. 06/28/20   Lind Repine, MD    Family History Family History  Problem Relation Age of Onset   Cancer Mother    Early death Mother    Arthritis Father    Alcohol abuse Father    Heart disease Father    Hyperlipidemia Father    Hypertension Father    Crohn's disease Neg Hx    Colon polyps Neg Hx    Colon cancer Neg Hx    Esophageal cancer Neg Hx    Stomach cancer Neg Hx    Rectal cancer Neg Hx     Social History Social History   Tobacco Use   Smoking status: Never   Smokeless tobacco: Never  Vaping Use    Vaping status: Never Used  Substance Use Topics   Alcohol use: Not Currently    Alcohol/week: 0.0 - 2.0 standard drinks of alcohol   Drug use: Never     Allergies   Patient has no known allergies.   Review of Systems Review of Systems  Per HPI  Physical Exam Triage Vital Signs ED Triage Vitals  Encounter Vitals Group     BP 02/04/24 0909 123/77     Systolic BP Percentile --      Diastolic BP Percentile --      Pulse Rate 02/04/24 0909 67     Resp 02/04/24 0909 18     Temp 02/04/24 0909 98 F (36.7 C)     Temp Source 02/04/24 0909 Oral     SpO2 02/04/24 0909 98 %     Weight --      Height --      Head Circumference --      Peak Flow --      Pain Score 02/04/24 0910 10     Pain Loc --      Pain Education --      Exclude from Growth Chart --    No data found.  Updated Vital Signs BP 123/77 (BP Location: Right Arm)   Pulse 67   Temp 98 F (36.7 C) (Oral)   Resp 18   SpO2 98%   Visual Acuity Right Eye Distance:   Left Eye Distance:   Bilateral Distance:    Right Eye Near:   Left Eye Near:    Bilateral Near:     Physical Exam Vitals and nursing note reviewed.  Constitutional:      General: He is awake. He is not in acute distress.    Appearance: Normal appearance. He is well-developed and well-groomed. He is not ill-appearing.  Musculoskeletal:     Right knee: Normal.     Right lower leg: Swelling and tenderness present. No deformity, lacerations or bony tenderness. No edema.     Right ankle: Normal.  Skin:    General: Skin is warm and dry.  Neurological:     Mental Status: He is alert.  Psychiatric:        Behavior: Behavior is cooperative.      UC Treatments / Results  Labs (all labs ordered are listed, but only abnormal results are displayed) Labs Reviewed - No data to display  EKG  Radiology No results found.  Procedures Procedures (including critical care time)  Medications Ordered in UC Medications - No data to  display  Initial Impression / Assessment and Plan / UC Course  I have reviewed the triage vital signs and the nursing notes.  Pertinent labs & imaging results that were available during my care of the patient were reviewed by me and considered in my medical decision making (see chart for details).     Patient is well-appearing.  Vitals are stable.  Upon assessment there is mild swelling and tenderness noted to right upper calf muscle.  Without bruising or significant swelling.  Patient has some difficulty bearing weight on the leg due to pain.  Deferred imaging due to unlikelihood that there is any bone involvement in this injury.  Prescribed naproxen for pain and inflammation.  Recommended rest, ice, and elevating his leg.  Given [facility] to follow-up with.  Discussed return precautions. Final Clinical Impressions(s) / UC Diagnoses   Final diagnoses:  Right calf pain     Discharge Instructions      You can take Naproxen twice daily as needed for pain.  Do not take this with other NSAIDs including ibuprofen, Advil, Motrin, Aleve, and Mobic . Rest ice and elevate your leg as often as possible. I have attached [facility] that you can go to today for further evaluation.  They do have walk-in clinic hours. Return here as needed.  ED Prescriptions     Medication Sig Dispense Auth. Provider   naproxen (NAPROSYN) 500 MG tablet Take 1 tablet (500 mg total) by mouth 2 (two) times daily. 30 tablet Levora Reas A, NP      PDMP not reviewed this encounter.   Levora Reas A, NP 02/04/24 [phone]

## 2024-02-04 NOTE — Discharge Instructions (Signed)
 You can take Naproxen twice daily as needed for pain.  Do not take this with other NSAIDs including ibuprofen, Advil, Motrin, Aleve, and Mobic . Rest ice and elevate your leg as often as possible. I have attached [facility] that you can go to today for further evaluation.  They do have walk-in clinic hours. Return here as needed.

## 2024-02-04 NOTE — ED Triage Notes (Signed)
 Pt reports right leg injury while at work. States he felt a pup on his right leg one hour ago and is been with a lot of pain since then.

## 2024-02-09 ENCOUNTER — Other Ambulatory Visit (HOSPITAL_BASED_OUTPATIENT_CLINIC_OR_DEPARTMENT_OTHER): Payer: Self-pay | Admitting: Family Medicine

## 2024-02-09 ENCOUNTER — Ambulatory Visit (HOSPITAL_BASED_OUTPATIENT_CLINIC_OR_DEPARTMENT_OTHER)
Admission: RE | Admit: 2024-02-09 | Discharge: 2024-02-09 | Disposition: A | Discharge status: Home / Self Care | Source: Ambulatory Visit | Attending: Family Medicine | Admitting: Family Medicine

## 2024-02-09 DIAGNOSIS — M79604 Pain in right leg: Secondary | ICD-10-CM

## 2024-02-20 IMAGING — MR MR HIP*L* W/O CM
4 of 5 series · 26 of 40 positions shown · non-contrast
Comparison: None Available.

CLINICAL DATA: Pain between the stent and strain the left side for
4 months. Injured while running.

EXAM:
MR OF THE LEFT HIP WITHOUT CONTRAST
TECHNIQUE: Multiplanar, multisequence MR imaging was performed. No intravenous
contrast was administered.

[Series 3: T1 · coronal · 4.0mm · 0.94mm/px · 8 of 36 slices shown]
[im 1/36]
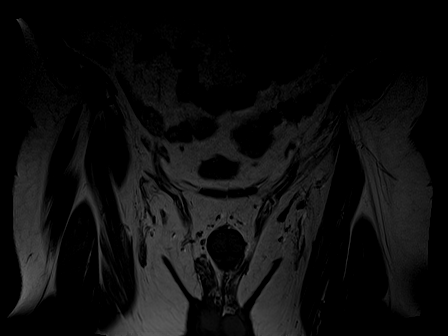
[im 4/36]
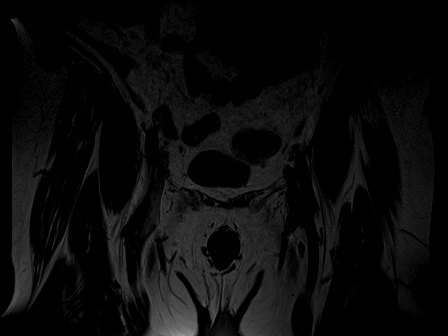
[im 12/36]
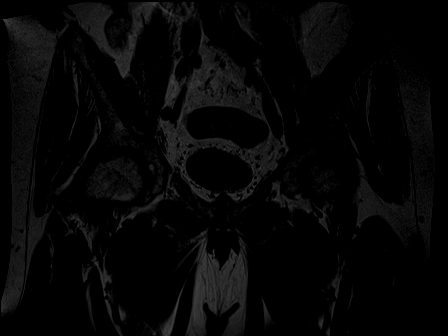
[im 16/36]
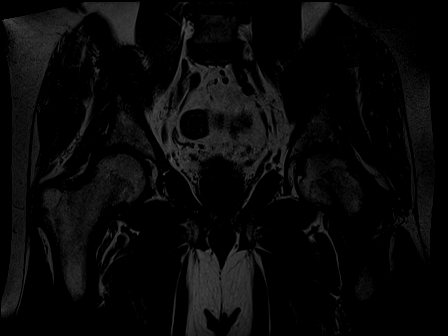
[im 20/36]
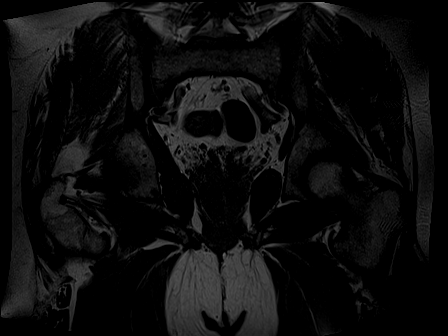
[im 24/36]
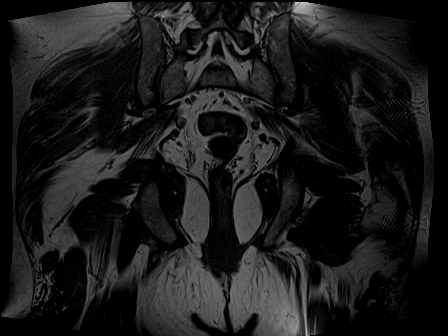
[im 32/36]
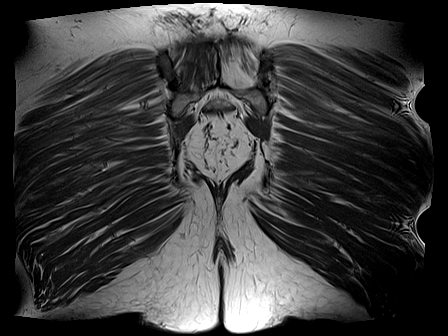
[im 36/36]
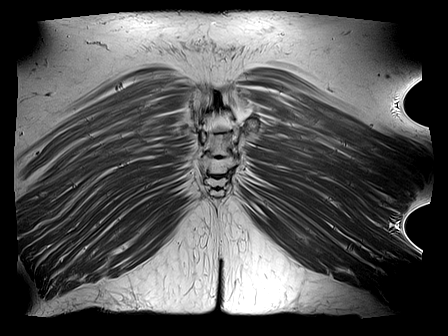

[Series 4: STIR · coronal · 4.0mm · 1.31mm/px · 5 of 36 slices shown]
[im 1/36]
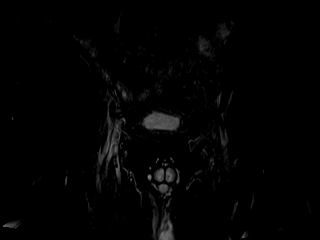
[im 5/36]
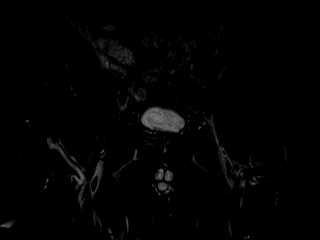
[im 9/36]
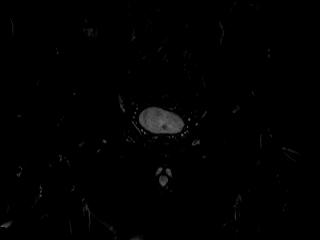
[im 18/36]
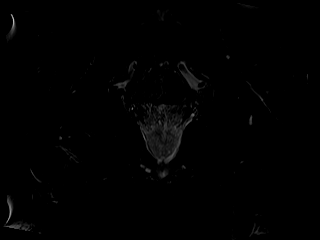
[im 31/36]
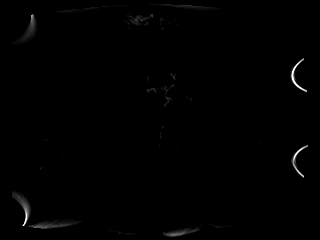

[Series 5: PD fat-sat · sagittal · 4.5mm · 0.39mm/px · 7 of 26 slices shown (1 of 2)]
[im 1/26]
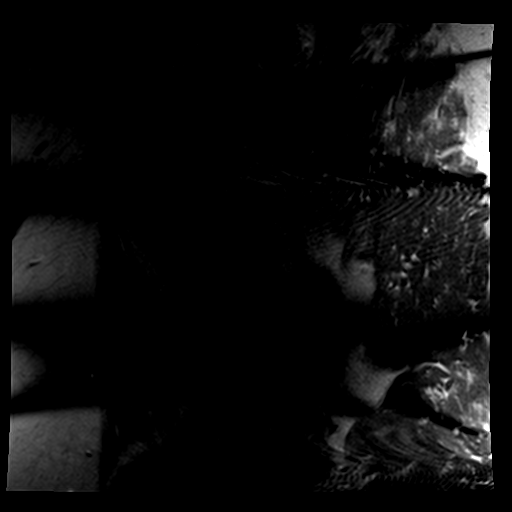
[im 5/26]
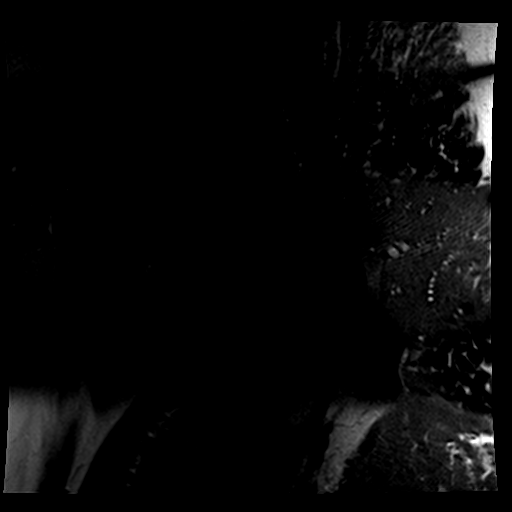
[im 9/26]
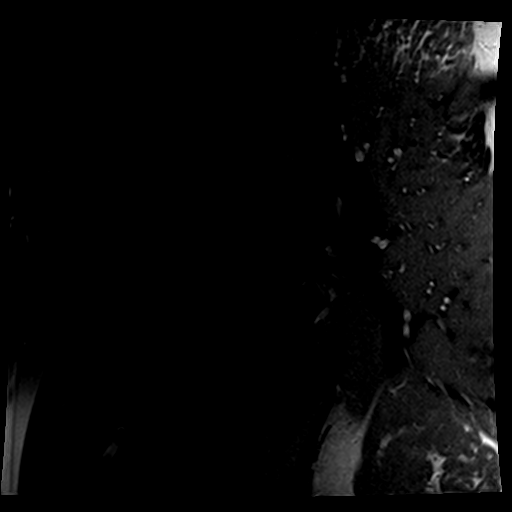
[im 13/26]
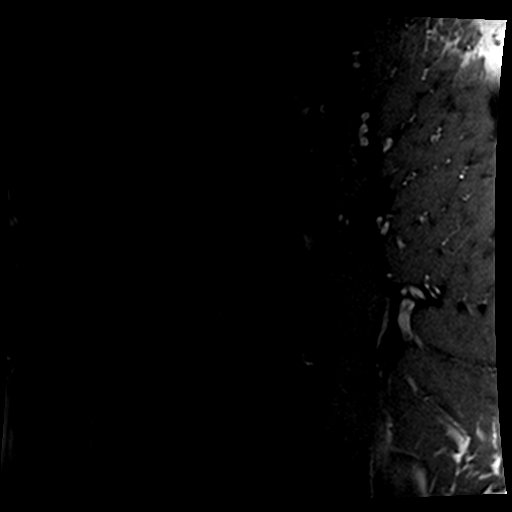
[im 17/26]
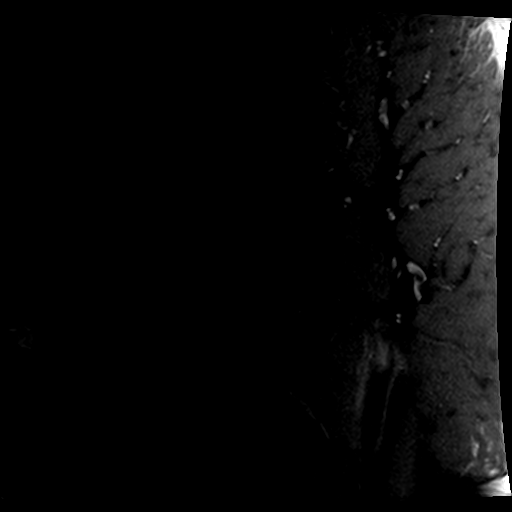
[im 21/26]
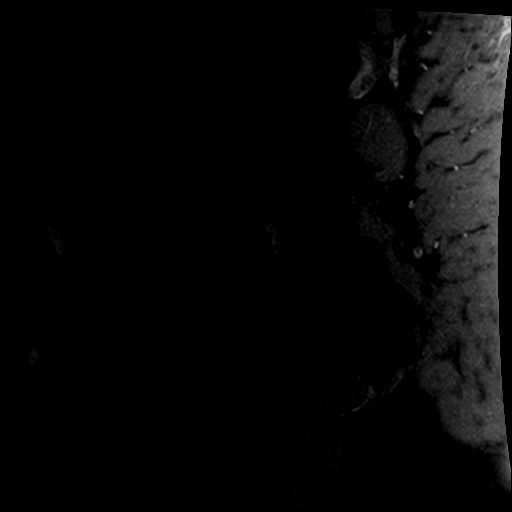
[im 26/26]
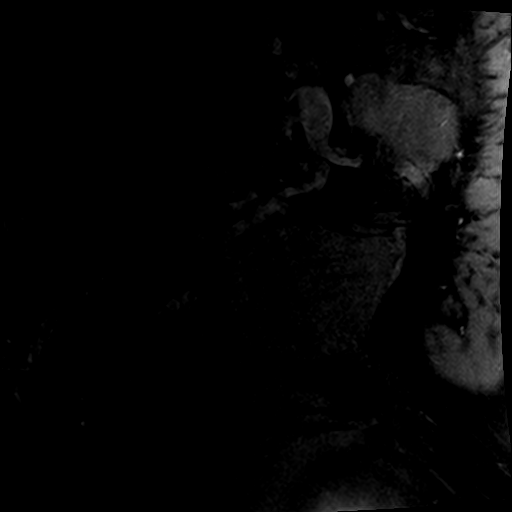

[Series 7: PD fat-sat · coronal · 4.5mm · 0.35mm/px · 6 of 24 slices shown (2 of 2)]
[im 1/24]
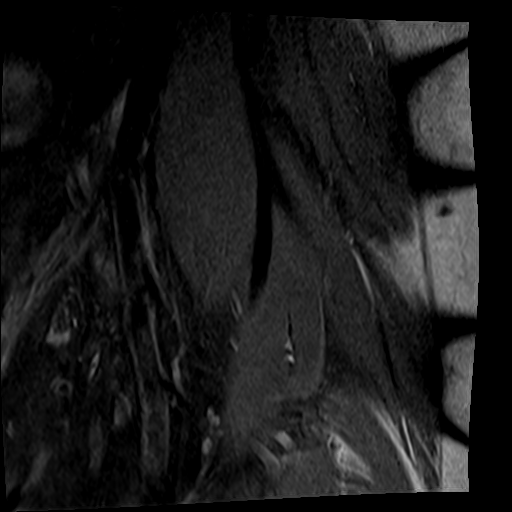
[im 5/24]
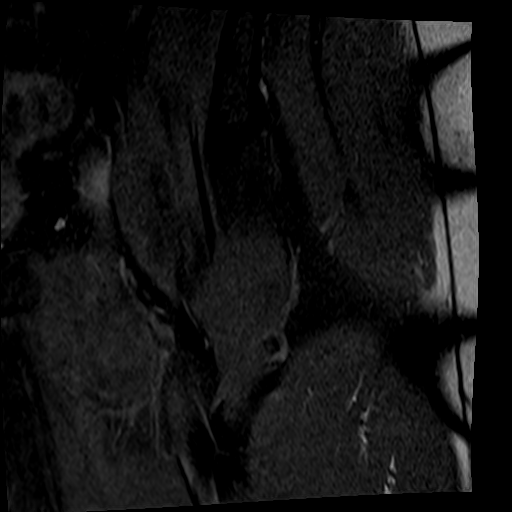
[im 10/24]
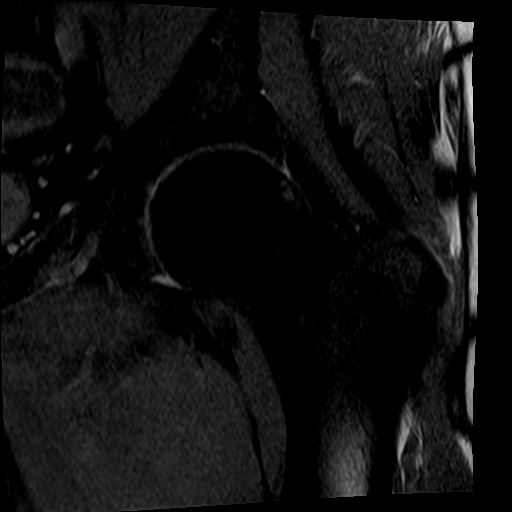
[im 14/24]
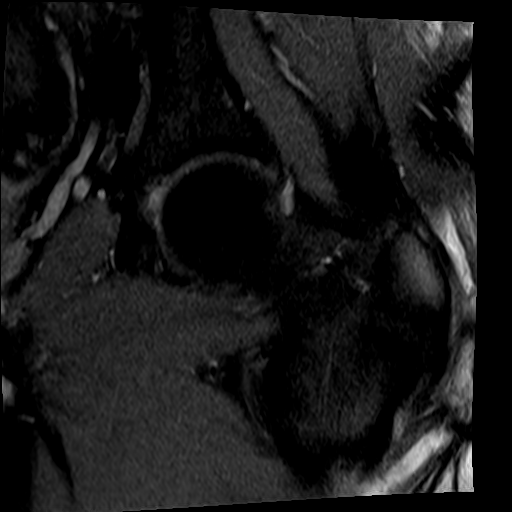
[im 19/24]
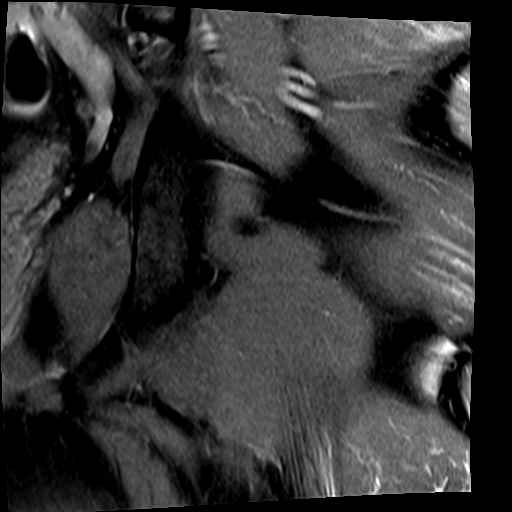
[im 24/24]
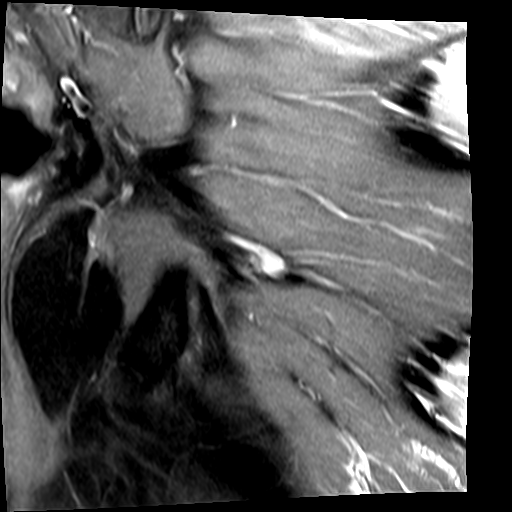

[26 of 40 positions shown; findings below may reference images not displayed]

FINDINGS: Bones:

No hip fracture, dislocation or avascular necrosis.

No periosteal reaction or bone destruction. No aggressive osseous
lesion.

Mild osteoarthritis of bilateral SI joints.

Degenerative disease with disc height loss at L4-5 and L5-S1. Mild
osteitis pubis.

Articular cartilage and labrum

Articular cartilage: Partial-thickness cartilage loss of the left
femoral head and acetabulum with subchondral cystic changes in the
superolateral femoral head and superior acetabulum.

Labrum: Left labral degeneration with fraying of the anterior
labrum.

Joint or bursal effusion

Joint effusion:  No hip joint effusion.  No SI joint effusion.

Bursae:  No bursa formation.

Muscles and tendons

Flexors: Normal.

Extensors: Normal.

Abductors: Normal.

Adductors: Normal.

Gluteals: Normal.

Hamstrings: Partial-thickness tear of the hamstring origin
bilaterally.

Other findings

No pelvic free fluid. No fluid collection or hematoma. No inguinal
lymphadenopathy. No inguinal hernia.
IMPRESSION: 1. Mild-moderate osteoarthritis of the left hip.
2. Left labral degeneration with fraying of the anterior labrum.
3. Partial-thickness tear of the hamstring origin bilaterally.

## 2024-03-22 ENCOUNTER — Other Ambulatory Visit

## 2024-03-22 ENCOUNTER — Inpatient Hospital Stay
Admission: RE | Admit: 2024-03-22 | Discharge: 2024-03-22 | Disposition: A | Discharge status: Home / Self Care | Source: Ambulatory Visit | Attending: Surgery

## 2024-03-22 DIAGNOSIS — D171 Benign lipomatous neoplasm of skin and subcutaneous tissue of trunk: Secondary | ICD-10-CM

## 2024-03-22 MED ORDER — IOPAMIDOL (ISOVUE-300) INJECTION 61%
100.0000 mL | Freq: Once | INTRAVENOUS | Status: AC | PRN
  Administered 2024-03-22: 100 mL via INTRAVENOUS

## 2024-04-20 ENCOUNTER — Other Ambulatory Visit: Payer: Self-pay | Admitting: Family Medicine

## 2024-04-20 DIAGNOSIS — M2550 Pain in unspecified joint: Secondary | ICD-10-CM

## 2024-05-03 NOTE — Pre-Procedure Instructions (Signed)
 Surgical Instructions   Your procedure is scheduled on May 10, 2024. Report to [facility] Main Entrance A at 11:00 A.M., then check in with the Admitting office. Any questions or running late day of surgery: call [phone]  Questions prior to your surgery date: call [phone], Monday-Friday, 8am-4pm. If you experience any cold or flu symptoms such as cough, fever, chills, shortness of breath, etc. between now and your scheduled surgery, please notify us  at the above number.     Remember:  Do not eat or drink after midnight the night before your surgery    Take these medicines the morning of surgery with A SIP OF WATER: amLODipine  (NORVASC )  pregabalin (LYRICA)    One week prior to surgery, STOP taking any Aspirin (unless otherwise instructed by your surgeon) Aleve , Naproxen , Ibuprofen , Motrin , Advil , Goody's, BC's, all herbal medications, fish oil, and non-prescription vitamins. This includes your medication: meloxicam  (MOBIC )    WHAT DO I DO ABOUT MY DIABETES MEDICATION?   STOP taking your FARXIGA three days prior to surgery. Your last dose will be August 14th.     STOP taking your MOUNJARO one week prior to surgery. DO NOT take any doses after August 10th.   HOW TO MANAGE YOUR DIABETES BEFORE AND AFTER SURGERY  Why is it important to control my blood sugar before and after surgery? Improving blood sugar levels before and after surgery helps healing and can limit problems. A way of improving blood sugar control is eating a healthy diet by:  Eating less sugar and carbohydrates  Increasing activity/exercise  Talking with your doctor about reaching your blood sugar goals High blood sugars (greater than 180 mg/dL) can raise your risk of infections and slow your recovery, so you will need to focus on controlling your diabetes during the weeks before surgery. Make sure that the doctor who takes care of your diabetes knows about your planned surgery including the date  and location.  How do I manage my blood sugar before surgery? Check your blood sugar at least 4 times a day, starting 2 days before surgery, to make sure that the level is not too high or low.  Check your blood sugar the morning of your surgery when you wake up and every 2 hours until you get to the Short Stay unit.  If your blood sugar is less than 70 mg/dL, you will need to treat for low blood sugar: Do not take insulin . Treat a low blood sugar (less than 70 mg/dL) with  cup of clear juice (cranberry or apple), 4 glucose tablets, OR glucose gel. Recheck blood sugar in 15 minutes after treatment (to make sure it is greater than 70 mg/dL). If your blood sugar is not greater than 70 mg/dL on recheck, call [phone] for further instructions. Report your blood sugar to the short stay nurse when you get to Short Stay.  If you are admitted to the hospital after surgery: Your blood sugar will be checked by the staff and you will probably be given insulin  after surgery (instead of oral diabetes medicines) to make sure you have good blood sugar levels. The goal for blood sugar control after surgery is 80-180 mg/dL.                      Do NOT Smoke (Tobacco/Vaping) for 24 hours prior to your procedure.  If you use a CPAP at night, you may bring your mask/headgear for your overnight stay.   You will be asked  to remove any contacts, glasses, piercing's, hearing aid's, dentures/partials prior to surgery. Please bring cases for these items if needed.    Patients discharged the day of surgery will not be allowed to drive home, and someone needs to stay with them for 24 hours.  SURGICAL WAITING ROOM VISITATION Patients may have no more than 2 support people in the waiting area - these visitors may rotate.   Pre-op nurse will coordinate an appropriate time for 1 ADULT support person, who may not rotate, to accompany patient in pre-op.  Children under the age of 69 must have an adult with them who  is not the patient and must remain in the main waiting area with an adult.  If the patient needs to stay at the hospital during part of their recovery, the visitor guidelines for inpatient rooms apply.  Please refer to the [facility] website for the visitor guidelines for any additional information.   If you received a COVID test during your pre-op visit  it is requested that you wear a mask when out in public, stay away from anyone that may not be feeling well and notify your surgeon if you develop symptoms. If you have been in contact with anyone that has tested positive in the last 10 days please notify you surgeon.      Pre-operative CHG Bathing Instructions   You can play a key role in reducing the risk of infection after surgery. Your skin needs to be as free of germs as possible. You can reduce the number of germs on your skin by washing with CHG (chlorhexidine  gluconate) soap before surgery. CHG is an antiseptic soap that kills germs and continues to kill germs even after washing.   DO NOT use if you have an allergy to chlorhexidine /CHG or antibacterial soaps. If your skin becomes reddened or irritated, stop using the CHG and notify one of our RNs at [phone].              TAKE A SHOWER THE NIGHT BEFORE SURGERY AND THE DAY OF SURGERY    Please keep in mind the following:  DO NOT shave, including legs and underarms, 48 hours prior to surgery.   You may shave your face before/day of surgery.  Place clean sheets on your bed the night before surgery Use a clean washcloth (not used since being washed) for each shower. DO NOT sleep with pet's night before surgery.  CHG Shower Instructions:  Wash your face and private area with normal soap. If you choose to wash your hair, wash first with your normal shampoo.  After you use shampoo/soap, rinse your hair and body thoroughly to remove shampoo/soap residue.  Turn the water OFF and apply half the bottle of CHG soap to a CLEAN  washcloth.  Apply CHG soap ONLY FROM YOUR NECK DOWN TO YOUR TOES (washing for 3-5 minutes)  DO NOT use CHG soap on face, private areas, open wounds, or sores.  Pay special attention to the area where your surgery is being performed.  If you are having back surgery, having someone wash your back for you may be helpful. Wait 2 minutes after CHG soap is applied, then you may rinse off the CHG soap.  Pat dry with a clean towel  Put on clean pajamas    Additional instructions for the day of surgery: DO NOT APPLY any lotions, deodorants, cologne, or perfumes.   Do not wear jewelry or makeup Do not wear nail polish, gel polish, artificial  nails, or any other type of covering on natural nails (fingers and toes) Do not bring valuables to the hospital. [facility] is not responsible for valuables/personal belongings. Put on clean/comfortable clothes.  Please brush your teeth.  Ask your nurse before applying any prescription medications to the skin.

## 2024-05-04 ENCOUNTER — Encounter (HOSPITAL_COMMUNITY): Payer: Self-pay

## 2024-05-04 ENCOUNTER — Other Ambulatory Visit: Payer: Self-pay

## 2024-05-04 ENCOUNTER — Encounter (HOSPITAL_COMMUNITY)
Admission: RE | Admit: 2024-05-04 | Discharge: 2024-05-04 | Disposition: A | Discharge status: Home / Self Care | Source: Ambulatory Visit | Attending: Surgery | Admitting: Surgery

## 2024-05-04 VITALS — BP 116/79 | HR 54 | Temp 97.7°F | Resp 18 | Ht 75.0 in | Wt 284.4 lb

## 2024-05-04 DIAGNOSIS — I129 Hypertensive chronic kidney disease with stage 1 through stage 4 chronic kidney disease, or unspecified chronic kidney disease: Secondary | ICD-10-CM | POA: Diagnosis not present

## 2024-05-04 DIAGNOSIS — G4733 Obstructive sleep apnea (adult) (pediatric): Secondary | ICD-10-CM | POA: Insufficient documentation

## 2024-05-04 DIAGNOSIS — E119 Type 2 diabetes mellitus without complications: Secondary | ICD-10-CM

## 2024-05-04 DIAGNOSIS — K219 Gastro-esophageal reflux disease without esophagitis: Secondary | ICD-10-CM | POA: Diagnosis not present

## 2024-05-04 DIAGNOSIS — Z01812 Encounter for preprocedural laboratory examination: Secondary | ICD-10-CM | POA: Insufficient documentation

## 2024-05-04 DIAGNOSIS — E785 Hyperlipidemia, unspecified: Secondary | ICD-10-CM | POA: Diagnosis not present

## 2024-05-04 DIAGNOSIS — N189 Chronic kidney disease, unspecified: Secondary | ICD-10-CM | POA: Diagnosis not present

## 2024-05-04 DIAGNOSIS — R011 Cardiac murmur, unspecified: Secondary | ICD-10-CM | POA: Diagnosis not present

## 2024-05-04 DIAGNOSIS — Z79899 Other long term (current) drug therapy: Secondary | ICD-10-CM | POA: Insufficient documentation

## 2024-05-04 DIAGNOSIS — I2510 Atherosclerotic heart disease of native coronary artery without angina pectoris: Secondary | ICD-10-CM | POA: Diagnosis not present

## 2024-05-04 DIAGNOSIS — E1122 Type 2 diabetes mellitus with diabetic chronic kidney disease: Secondary | ICD-10-CM | POA: Diagnosis not present

## 2024-05-04 DIAGNOSIS — I252 Old myocardial infarction: Secondary | ICD-10-CM | POA: Diagnosis not present

## 2024-05-04 DIAGNOSIS — D171 Benign lipomatous neoplasm of skin and subcutaneous tissue of trunk: Secondary | ICD-10-CM | POA: Insufficient documentation

## 2024-05-04 HISTORY — DX: Gastro-esophageal reflux disease without esophagitis: K21.9

## 2024-05-04 HISTORY — DX: Unspecified osteoarthritis, unspecified site: M19.90

## 2024-05-04 LAB — BASIC METABOLIC PANEL WITH GFR
Anion gap: 9 (ref 5–15)
BUN: 17 mg/dL (ref 6–20)
CO2: 29 mmol/L (ref 22–32)
Calcium: 9.7 mg/dL (ref 8.9–10.3)
Chloride: 99 mmol/L (ref 98–111)
Creatinine, Ser: 1.23 mg/dL (ref 0.61–1.24)
GFR, Estimated: >60 mL/min (ref >60)
Glucose, Bld: 118 mg/dL — ABNORMAL HIGH (ref 70–99)
Potassium: 4.1 mmol/L (ref 3.5–5.1)
Sodium: 137 mmol/L (ref 135–145)

## 2024-05-04 LAB — CBC
HCT: 50.1 % (ref 39.0–52.0)
Hemoglobin: 16.7 g/dL (ref 13.0–17.0)
MCH: 29.3 pg (ref 26.0–34.0)
MCHC: 33.3 g/dL (ref 30.0–36.0)
MCV: 88 fL (ref 80.0–100.0)
Platelets: 288 K/uL (ref 150–400)
RBC: 5.69 MIL/uL (ref 4.22–5.81)
RDW: 13.6 % (ref 11.5–15.5)
WBC: 7 K/uL (ref 4.0–10.5)
nRBC: 0 % (ref 0.0–0.2)

## 2024-05-04 LAB — GLUCOSE, CAPILLARY: Glucose-Capillary: 100 mg/dL — ABNORMAL HIGH (ref 70–99)

## 2024-05-04 NOTE — Progress Notes (Signed)
 PCP - Dr. Betty Swaziland Cardiologist - Dr. Aleene Passe - last office visit 12/05/2023 Endocrinologist - Dr. Littie Caffey  PPM/ICD - Denies Device Orders - n/a Rep Notified - n/a  Chest x-ray - n/a EKG - 12/05/2023 Stress Test - Per pt, attempted chemical stress test 10+ years ago, but unable to complete due to making him feel horrible ECHO - 09/07/2021 Cardiac Cath - Denies  Sleep Study - +OSA. Pt wears BiPAP every night. Pressure settings are 12-18  Pt is DM2. He rarely checks his blood sugars, but does have a meter at home. Normal fasting glucose around 100. CBG at pre-op appointment 100. A1c result pending.  Last dose of GLP1 agonist- Last dose of Mounjaro 04/30/24 GLP1 instructions: Pt instructed to not take anymore doses until after surgery SGLT-2 instructions: Hold Farxiga for three days. Last dose will be August 14th.  Blood Thinner Instructions: n/a Aspirin Instructions: n/a  NPO after midnight  COVID TEST- n/a   Anesthesia review: Yes. 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  Patient denies shortness of breath, fever, cough and chest pain at PAT appointment. Pt denies any respiratory illness/infection in the last two months.   All instructions explained to the patient, with a verbal understanding of the material. Patient agrees to go over the instructions while at home for a better understanding. Patient also instructed to self quarantine after being tested for COVID-19. The opportunity to ask questions was provided.

## 2024-05-05 LAB — HEMOGLOBIN A1C
Hgb A1c MFr Bld: 5.9 % — ABNORMAL HIGH (ref 4.8–5.6)
Mean Plasma Glucose: 123 mg/dL

## 2024-05-05 NOTE — Progress Notes (Signed)
 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  History includes never smoker, DM 2, HTN, CAD (reported prior MI but no cath or stenting; minimal CAD 08/2021 CCTA), murmur (trivial AR 08/2021), CKD, renal cancer (s/p right nephrectomy 2008), OSA (uses BiPAP), GERD, spinal surgery (laminectomy ~ 1990).   He became established with cardiologist Dr. Alveta in November 2020 for HTN and HLD. Although he reported being told he had a prior MI, he denied prior cath or stent. In 08/2021 he had cardiac testing due to upper abdominal/lower chest pain while running. TTE showed LVEFL 60-65%, no RWMA, grade 1 DD, normal RV systolic function, trivial AI, ascending aorta 37 mm. CCTA showed CAC 73  (70th percentile) with only scattered plaque with minimal non-obstructive CAD (0-24% LAD, LCX, RCA). At 12/05/23 follow-up, he denied further chest pain and had been running 3 miles a day most days. If he continues to los weight then consider discontinuing amlodipine  first if BP improves. Continue Lipitor for HLD. One year follow-up planned.  A1c 5.9%. Last Mounjaro 04/30/24. Last Farxiga planned for 05/06/24.   Anesthesia team to evaluate on the day of surgery.    VS: BP 116/79   Pulse (!) 54   Temp 36.5 C   Resp 18   Ht 6' 3 (1.905 m)   Wt 129 kg   SpO2 100%   BMI 35.55 kg/m   PROVIDERS: [place], Betty G, MD is PCP  Alveta Mungo, MD is cardiologist Tommas Pears, MD is endocrinologist   LABS: Labs reviewed: Acceptable for surgery. (all labs ordered are listed, but only abnormal results are displayed)  Labs Reviewed  GLUCOSE, CAPILLARY -  Abnormal; Notable for the following components:      Result Value   Glucose-Capillary 100 (*)    All other components within normal limits  HEMOGLOBIN A1C - Abnormal; Notable for the following components:   Hgb A1c MFr Bld 5.9 (*)    All other components within normal limits  BASIC METABOLIC PANEL WITH GFR - Abnormal; Notable for the following components:   Glucose, Bld 118 (*)    All other components within normal limits  CBC     IMAGES: CT pelvis 03/22/24: IMPRESSION: 1. No acute findings. 2. Sigmoid diverticulosis. 3.  Aortic Atherosclerosis (ICD10-I70.0).   EKG: 12/05/23: Normal sinus rhythm Cannot rule out Inferior infarct , age undetermined No previous ECGs available Confirmed by Alveta Mungo [phone]) on 12/05/2023 10:22:32 AM   CV: RLE Venous US  02/09/24: IMPRESSION: No right lower extremity DVT.   Echo 09/07/21: IMPRESSIONS   1. Left ventricular ejection fraction, by estimation, is 60 to 65%. The  left ventricle has normal function. The left ventricle has no regional  wall motion abnormalities. There is mild left ventricular hypertrophy.  Left ventricular diastolic parameters  are consistent with Grade I diastolic dysfunction (impaired relaxation).   2. Right ventricular systolic function is normal. The right ventricular  size is normal.   3. The mitral valve is normal in structure. No evidence of mitral valve  regurgitation. No evidence of mitral stenosis.   4. The aortic valve is tricuspid.  Aortic valve regurgitation is trivial.  No aortic stenosis is present.   5. Aortic dilatation noted. There is mild dilatation of the ascending  aorta, measuring 37 mm.   6. The inferior vena cava is normal in size with greater than 50%  respiratory variability, suggesting right atrial pressure of 3 mmHg.    CTA Coronary 09/07/21: Aorta:  Normal size.  No calcifications.  No dissection. Aortic Valve: Bileaflet.  No calcifications. Coronary Arteries:  Normal coronary  origin.  Right dominance. -  RCA: Large dominant artery that gives rise to PDA and PLAX. There is proximal calcified plaque, 0-24% stenosis with positive remodeling otherwise mild luminal irregularities through vessel. - Left main is a large artery that gives rise to LAD and CX arteries. - LAD is a large vessel that has no scattered small foci of calcified plaque, 0-24% stenosis. One diagonal. - LCX is a non-dominant artery that gives rise to one large OM1 branch. There is no small foci of calcified plaque 0-24% stenosis.   IMPRESSION: 1. Coronary calcium  score of 73. This was 68 percentile for age and sex matched control. 2. Normal coronary origin with right dominance. 3.  Scattered calcified plaque with minimal non obstructive CAD. 4.  Mildly dilated pulmonary artery (33mm). - Of note, Dr. Alveta suspected bileaflet AV may have been a typo/dictation error in the report. TTE on 09/07/21 showed a tricuspid AV.     Past Medical History:  Diagnosis Date   Arthritis    Blood in stool    history of blood in stool   Cancer (HCC) 2008   kidney-R   Chronic kidney disease    Diabetes mellitus without complication (HCC)    on meds   GERD (gastroesophageal reflux disease)    Heart murmur    History of colon polyps    Hypertension    Myocardial infarction (HCC)    hx of   Seasonal allergies    Sleep apnea    uses CPAP    Past Surgical History:  Procedure Laterality Date   [place] [facility]   COLONOSCOPY  09/26/2022   LAMINECTOMY  1989   L5-S1   LAMINECTOMY  1990   L4-L5   NEPHRECTOMY Right 2008   NOSE SURGERY  2015   ORIF ANKLE FRACTURE Left    WRIST SURGERY Right     MEDICATIONS:  amLODipine  (NORVASC ) 5 MG tablet   atorvastatin  (LIPITOR) 40 MG tablet   FARXIGA 10 MG TABS tablet   hydrochlorothiazide  (HYDRODIURIL ) 25 MG tablet   lisinopril  (ZESTRIL ) 20 MG tablet   meloxicam  (MOBIC ) 7.5 MG tablet   MOUNJARO 10 MG/0.5ML Pen   naproxen  (NAPROSYN ) 500  MG tablet   NON FORMULARY   potassium chloride  (KLOR-CON ) 10 MEQ tablet   pregabalin (LYRICA) 200 MG capsule   zolpidem  (AMBIEN ) 10 MG tablet    0.9 %  sodium chloride  infusion    [name], PA-C Surgical Short Stay/Anesthesiology [facility] Phone [phone] [facility] Phone [phone] 05/05/2024 3:50 PM

## 2024-05-05 NOTE — Anesthesia Preprocedure Evaluation (Signed)
 Anesthesia Evaluation  Patient identified by MRN, date of birth, ID band Patient awake    Reviewed: Allergy & Precautions, NPO status , Patient's Chart, lab work & pertinent test results  History of Anesthesia Complications Negative for: history of anesthetic complications  Airway Mallampati: III  TM Distance: >3 FB Neck ROM: Full    Dental  (+) Dental Advisory Given, Teeth Intact, Missing,    Pulmonary neg shortness of breath, sleep apnea and Continuous Positive Airway Pressure Ventilation , neg COPD, neg recent URI BiPAP   breath sounds clear to auscultation       Cardiovascular hypertension, Pt. on medications + CAD and + Past MI  (-) Cardiac Stents  Rhythm:Regular  1. Left ventricular ejection fraction, by estimation, is 60 to 65%. The  left ventricle has normal function. The left ventricle has no regional  wall motion abnormalities. There is mild left ventricular hypertrophy.  Left ventricular diastolic parameters  are consistent with Grade I diastolic dysfunction (impaired relaxation).   2. Right ventricular systolic function is normal. The right ventricular  size is normal.   3. The mitral valve is normal in structure. No evidence of mitral valve  regurgitation. No evidence of mitral stenosis.   4. The aortic valve is tricuspid. Aortic valve regurgitation is trivial.  No aortic stenosis is present.   5. Aortic dilatation noted. There is mild dilatation of the ascending  aorta, measuring 37 mm.   6. The inferior vena cava is normal in size with greater than 50%  respiratory variability, suggesting right atrial pressure of 3 mmHg.     Neuro/Psych neg Seizures    GI/Hepatic Neg liver ROS,GERD  Controlled,,  Endo/Other  diabetes    Renal/GU CRFRenal diseaseLab Results      Component                Value               Date                      NA                       137                 05/04/2024                K                         4.1                 05/04/2024                CO2                      29                  05/04/2024                GLUCOSE                  118 (H)             05/04/2024                BUN                      17  05/04/2024                CREATININE               1.23                05/04/2024                CALCIUM                   9.7                 05/04/2024                GFR                      48.52 (L)           01/01/2024                EGFR                     67                  08/20/2021                GFRNONAA                 >60                 05/04/2024                Musculoskeletal  (+) Arthritis ,    Abdominal   Peds  Hematology negative hematology ROS (+) Lab Results      Component                Value               Date                      WBC                      7.0                 05/04/2024                HGB                      16.7                05/04/2024                HCT                      50.1                05/04/2024                MCV                      88.0                05/04/2024                PLT                      288  05/04/2024              Anesthesia Other Findings   Reproductive/Obstetrics                              Anesthesia Physical Anesthesia Plan  ASA: 2  Anesthesia Plan: General   Post-op Pain Management: Ofirmev  IV (intra-op)*   Induction: Intravenous  PONV Risk Score and Plan: 3 and Ondansetron  and Dexamethasone   Airway Management Planned: LMA and Oral ETT  Additional Equipment: None  Intra-op Plan:   Post-operative Plan: Extubation in OR  Informed Consent: I have reviewed the patients History and Physical, chart, labs and discussed the procedure including the risks, benefits and alternatives for the proposed anesthesia with the patient or authorized representative who has indicated his/her understanding and  acceptance.     Dental advisory given  Plan Discussed with: CRNA  Anesthesia Plan Comments: (PAT note written 05/05/2024 by Rashid Whitenight, PA-C.  )         Anesthesia Quick Evaluation

## 2024-05-10 ENCOUNTER — Ambulatory Visit (HOSPITAL_COMMUNITY)
Admission: RE | Admit: 2024-05-10 | Discharge: 2024-05-10 | Disposition: A | Discharge status: Home / Self Care | Attending: Surgery | Admitting: Surgery

## 2024-05-10 ENCOUNTER — Encounter (HOSPITAL_COMMUNITY): Payer: Self-pay | Admitting: Surgery

## 2024-05-10 ENCOUNTER — Ambulatory Visit (HOSPITAL_BASED_OUTPATIENT_CLINIC_OR_DEPARTMENT_OTHER): Admitting: Certified Registered Nurse Anesthetist

## 2024-05-10 ENCOUNTER — Encounter (HOSPITAL_COMMUNITY)
Admission: RE | Disposition: A | Discharge status: Home / Self Care | Payer: Self-pay | Source: Home / Self Care | Attending: Surgery

## 2024-05-10 ENCOUNTER — Other Ambulatory Visit: Payer: Self-pay

## 2024-05-10 ENCOUNTER — Ambulatory Visit (HOSPITAL_COMMUNITY): Payer: Self-pay | Admitting: Vascular Surgery

## 2024-05-10 DIAGNOSIS — D171 Benign lipomatous neoplasm of skin and subcutaneous tissue of trunk: Secondary | ICD-10-CM | POA: Diagnosis present

## 2024-05-10 DIAGNOSIS — L720 Epidermal cyst: Secondary | ICD-10-CM | POA: Insufficient documentation

## 2024-05-10 DIAGNOSIS — E1122 Type 2 diabetes mellitus with diabetic chronic kidney disease: Secondary | ICD-10-CM | POA: Insufficient documentation

## 2024-05-10 DIAGNOSIS — N189 Chronic kidney disease, unspecified: Secondary | ICD-10-CM | POA: Diagnosis not present

## 2024-05-10 DIAGNOSIS — I252 Old myocardial infarction: Secondary | ICD-10-CM | POA: Diagnosis not present

## 2024-05-10 DIAGNOSIS — I2510 Atherosclerotic heart disease of native coronary artery without angina pectoris: Secondary | ICD-10-CM | POA: Insufficient documentation

## 2024-05-10 DIAGNOSIS — G4730 Sleep apnea, unspecified: Secondary | ICD-10-CM | POA: Insufficient documentation

## 2024-05-10 DIAGNOSIS — K219 Gastro-esophageal reflux disease without esophagitis: Secondary | ICD-10-CM | POA: Insufficient documentation

## 2024-05-10 DIAGNOSIS — I10 Essential (primary) hypertension: Secondary | ICD-10-CM

## 2024-05-10 DIAGNOSIS — E119 Type 2 diabetes mellitus without complications: Secondary | ICD-10-CM

## 2024-05-10 DIAGNOSIS — I129 Hypertensive chronic kidney disease with stage 1 through stage 4 chronic kidney disease, or unspecified chronic kidney disease: Secondary | ICD-10-CM | POA: Diagnosis not present

## 2024-05-10 HISTORY — PX: EXCISION MASS, BACK: SHX7560

## 2024-05-10 LAB — GLUCOSE, CAPILLARY
Glucose-Capillary: 94 mg/dL (ref 70–99)
Glucose-Capillary: 122 mg/dL — ABNORMAL HIGH (ref 70–99)

## 2024-05-10 SURGERY — EXCISION MASS, BACK
Anesthesia: General

## 2024-05-10 MED ORDER — CEFAZOLIN SODIUM-DEXTROSE 3-4 GM/150ML-% IV SOLN
3.0000 g | INTRAVENOUS | Status: AC
  Administered 2024-05-10: 3 g via INTRAVENOUS

## 2024-05-10 MED ORDER — PHENYLEPHRINE 80 MCG/ML (10ML) SYRINGE FOR IV PUSH (FOR BLOOD PRESSURE SUPPORT)
PREFILLED_SYRINGE | INTRAVENOUS | Status: DC | PRN
  Administered 2024-05-10: 80 ug via INTRAVENOUS

## 2024-05-10 MED ORDER — IBUPROFEN 600 MG PO TABS: 600.0000 mg | ORAL_TABLET | Freq: Four times a day (QID) | ORAL | 1 refills | Status: AC | PRN

## 2024-05-10 MED ORDER — OXYCODONE HCL 5 MG PO TABS: 5.0000 mg | ORAL_TABLET | ORAL | 0 refills | Status: AC | PRN

## 2024-05-10 MED ORDER — MIDAZOLAM HCL 2 MG/2ML IJ SOLN
INTRAMUSCULAR | Status: AC
  Filled 2024-05-10: qty 2

## 2024-05-10 MED ORDER — ACETAMINOPHEN 500 MG PO TABS: 1000.0000 mg | ORAL_TABLET | Freq: Four times a day (QID) | ORAL | 3 refills | Status: AC

## 2024-05-10 MED ORDER — CHLORHEXIDINE GLUCONATE 0.12 % MT SOLN
OROMUCOSAL | Status: AC
  Administered 2024-05-10: 15 mL via OROMUCOSAL
  Filled 2024-05-10: qty 15

## 2024-05-10 MED ORDER — POLYETHYLENE GLYCOL 3350 17 G PO PACK: 17.0000 g | PACK | Freq: Every day | ORAL | 1 refills | Status: AC

## 2024-05-10 MED ORDER — ROCURONIUM BROMIDE 10 MG/ML (PF) SYRINGE
PREFILLED_SYRINGE | INTRAVENOUS | Status: DC | PRN
  Administered 2024-05-10: 80 mg via INTRAVENOUS

## 2024-05-10 MED ORDER — SUGAMMADEX SODIUM 200 MG/2ML IV SOLN
INTRAVENOUS | Status: DC | PRN
  Administered 2024-05-10: 300 mg via INTRAVENOUS

## 2024-05-10 MED ORDER — BUPIVACAINE HCL (PF) 0.25 % IJ SOLN
INTRAMUSCULAR | Status: DC | PRN
  Administered 2024-05-10: 30 mL

## 2024-05-10 MED ORDER — FENTANYL CITRATE (PF) 100 MCG/2ML IJ SOLN: 25.0000 ug | INTRAMUSCULAR | Status: DC | PRN

## 2024-05-10 MED ORDER — ENOXAPARIN SODIUM 40 MG/0.4ML IJ SOSY: 40.0000 mg | PREFILLED_SYRINGE | Freq: Once | INTRAMUSCULAR | Status: AC

## 2024-05-10 MED ORDER — LIDOCAINE 2% (20 MG/ML) 5 ML SYRINGE
INTRAMUSCULAR | Status: DC | PRN
  Administered 2024-05-10: 60 mg via INTRAVENOUS

## 2024-05-10 MED ORDER — DEXAMETHASONE SODIUM PHOSPHATE 10 MG/ML IJ SOLN
INTRAMUSCULAR | Status: DC | PRN
  Administered 2024-05-10: 10 mg via INTRAVENOUS

## 2024-05-10 MED ORDER — PROPOFOL 10 MG/ML IV BOLUS
INTRAVENOUS | Status: AC
  Filled 2024-05-10: qty 20

## 2024-05-10 MED ORDER — ACETAMINOPHEN 10 MG/ML IV SOLN
INTRAVENOUS | Status: DC | PRN
  Administered 2024-05-10: 1000 mg via INTRAVENOUS

## 2024-05-10 MED ORDER — OXYCODONE HCL 5 MG PO TABS: 5.0000 mg | ORAL_TABLET | Freq: Once | ORAL | Status: DC | PRN

## 2024-05-10 MED ORDER — BUPIVACAINE LIPOSOME 1.3 % IJ SUSP: 20.0000 mL | Freq: Once | INTRAMUSCULAR | Status: DC

## 2024-05-10 MED ORDER — ENOXAPARIN SODIUM 40 MG/0.4ML IJ SOSY
PREFILLED_SYRINGE | INTRAMUSCULAR | Status: AC
  Administered 2024-05-10: 40 mg via SUBCUTANEOUS
  Filled 2024-05-10: qty 0.4

## 2024-05-10 MED ORDER — CEFAZOLIN SODIUM-DEXTROSE 3-4 GM/150ML-% IV SOLN
INTRAVENOUS | Status: AC
  Filled 2024-05-10: qty 150

## 2024-05-10 MED ORDER — CHLORHEXIDINE GLUCONATE CLOTH 2 % EX PADS: 6.0000 | MEDICATED_PAD | Freq: Once | CUTANEOUS | Status: DC

## 2024-05-10 MED ORDER — CHLORHEXIDINE GLUCONATE 0.12 % MT SOLN: 15.0000 mL | Freq: Once | OROMUCOSAL | Status: AC

## 2024-05-10 MED ORDER — FENTANYL CITRATE (PF) 250 MCG/5ML IJ SOLN
INTRAMUSCULAR | Status: DC | PRN
  Administered 2024-05-10: 100 ug via INTRAVENOUS

## 2024-05-10 MED ORDER — INSULIN ASPART 100 UNIT/ML IJ SOLN: 0.0000 [IU] | INTRAMUSCULAR | Status: DC | PRN

## 2024-05-10 MED ORDER — BUPIVACAINE LIPOSOME 1.3 % IJ SUSP
INTRAMUSCULAR | Status: AC
  Filled 2024-05-10: qty 20

## 2024-05-10 MED ORDER — ACETAMINOPHEN 10 MG/ML IV SOLN: 1000.0000 mg | Freq: Once | INTRAVENOUS | Status: DC | PRN

## 2024-05-10 MED ORDER — FENTANYL CITRATE (PF) 250 MCG/5ML IJ SOLN
INTRAMUSCULAR | Status: AC
  Filled 2024-05-10: qty 5

## 2024-05-10 MED ORDER — LACTATED RINGERS IV SOLN: INTRAVENOUS | Status: DC

## 2024-05-10 MED ORDER — PROPOFOL 10 MG/ML IV BOLUS
INTRAVENOUS | Status: DC | PRN
  Administered 2024-05-10: 100 mg via INTRAVENOUS
  Administered 2024-05-10: 200 mg via INTRAVENOUS

## 2024-05-10 MED ORDER — 0.9 % SODIUM CHLORIDE (POUR BTL) OPTIME
TOPICAL | Status: DC | PRN
  Administered 2024-05-10: 1000 mL

## 2024-05-10 MED ORDER — ORAL CARE MOUTH RINSE: 15.0000 mL | Freq: Once | OROMUCOSAL | Status: AC

## 2024-05-10 MED ORDER — METHOCARBAMOL 750 MG PO TABS: 750.0000 mg | ORAL_TABLET | Freq: Four times a day (QID) | ORAL | 1 refills | Status: AC

## 2024-05-10 MED ORDER — BUPIVACAINE HCL (PF) 0.25 % IJ SOLN
INTRAMUSCULAR | Status: AC
  Filled 2024-05-10: qty 30

## 2024-05-10 MED ORDER — ONDANSETRON HCL 4 MG/2ML IJ SOLN
INTRAMUSCULAR | Status: DC | PRN
  Administered 2024-05-10: 4 mg via INTRAVENOUS

## 2024-05-10 MED ORDER — OXYCODONE HCL 5 MG/5ML PO SOLN: 5.0000 mg | Freq: Once | ORAL | Status: DC | PRN

## 2024-05-10 MED ORDER — MIDAZOLAM HCL 2 MG/2ML IJ SOLN
INTRAMUSCULAR | Status: DC | PRN
  Administered 2024-05-10: 2 mg via INTRAVENOUS

## 2024-05-10 SURGICAL SUPPLY — 34 items
BAG COUNTER SPONGE SURGICOUNT (BAG) ×1 IMPLANT
CANISTER SUCTION 3000ML PPV (SUCTIONS) ×1 IMPLANT
CHLORAPREP W/TINT 26 (MISCELLANEOUS) ×1 IMPLANT
COVER SURGICAL LIGHT HANDLE (MISCELLANEOUS) ×1 IMPLANT
DERMABOND ADVANCED .7 DNX12 (GAUZE/BANDAGES/DRESSINGS) ×1 IMPLANT
DRAPE LAPAROSCOPIC ABDOMINAL (DRAPES) IMPLANT
DRAPE LAPAROTOMY 100X72 PEDS (DRAPES) IMPLANT
DRSG TEGADERM 4X4.75 (GAUZE/BANDAGES/DRESSINGS) IMPLANT
DRSG TELFA 3X8 NADH STRL (GAUZE/BANDAGES/DRESSINGS) IMPLANT
ELECTRODE REM PT RTRN 9FT ADLT (ELECTROSURGICAL) ×1 IMPLANT
GAUZE 4X4 16PLY ~~LOC~~+RFID DBL (SPONGE) IMPLANT
GAUZE SPONGE 4X4 12PLY STRL (GAUZE/BANDAGES/DRESSINGS) IMPLANT
GLOVE BIO SURGEON STRL SZ 6.5 (GLOVE) ×1 IMPLANT
GLOVE BIOGEL PI IND STRL 6 (GLOVE) ×1 IMPLANT
GOWN STRL REUS W/ TWL LRG LVL3 (GOWN DISPOSABLE) ×1 IMPLANT
KIT BASIN OR (CUSTOM PROCEDURE TRAY) ×1 IMPLANT
KIT TURNOVER KIT B (KITS) ×1 IMPLANT
MARKER SKIN DUAL TIP RULER LAB (MISCELLANEOUS) ×1 IMPLANT
NDL HYPO 25GX1X1/2 BEV (NEEDLE) ×1 IMPLANT
NEEDLE HYPO 25GX1X1/2 BEV (NEEDLE) ×1 IMPLANT
NS IRRIG 1000ML POUR BTL (IV SOLUTION) ×1 IMPLANT
PACK GENERAL/GYN (CUSTOM PROCEDURE TRAY) ×1 IMPLANT
PAD ARMBOARD POSITIONER FOAM (MISCELLANEOUS) ×2 IMPLANT
SPECIMEN JAR SMALL (MISCELLANEOUS) ×1 IMPLANT
STRIP CLOSURE SKIN 1/2X4 (GAUZE/BANDAGES/DRESSINGS) IMPLANT
SUT ETHILON 3 0 PS 1 (SUTURE) IMPLANT
SUT MNCRL AB 4-0 PS2 18 (SUTURE) ×1 IMPLANT
SUT SILK 2 0 PERMA HAND 18 BK (SUTURE) IMPLANT
SUT SILK 3-0 FS1 18XBRD (SUTURE) IMPLANT
SUT VIC AB 3-0 SH 18 (SUTURE) IMPLANT
SUT VIC AB 3-0 SH 27X BRD (SUTURE) ×1 IMPLANT
SYR CONTROL 10ML LL (SYRINGE) ×1 IMPLANT
TOWEL GREEN STERILE (TOWEL DISPOSABLE) ×1 IMPLANT
TOWEL GREEN STERILE FF (TOWEL DISPOSABLE) ×1 IMPLANT

## 2024-05-10 NOTE — H&P (Signed)
 Wayne Larsen is an 58 y.o. male.   HPI: 57M with soft tissue mass of lower back. The patient has had no hospitalizations, doctors visits, ER visits, surgeries, or newly diagnosed allergies since being seen in the office.    Past Medical History:  Diagnosis Date   Arthritis    Blood in stool    history of blood in stool   Cancer (HCC) 2008   kidney-R   Chronic kidney disease    Diabetes mellitus without complication (HCC)    on meds   GERD (gastroesophageal reflux disease)    Heart murmur    History of colon polyps    Hypertension    Myocardial infarction (HCC)    hx of   Seasonal allergies    Sleep apnea    uses CPAP    Past Surgical History:  Procedure Laterality Date   [facility] [facility]   COLONOSCOPY  09/26/2022   LAMINECTOMY  1989   L5-S1   LAMINECTOMY  1990   L4-L5   NEPHRECTOMY Right 2008   NOSE SURGERY  2015   ORIF ANKLE FRACTURE Left    WRIST SURGERY Right     Family History  Problem Relation Age of Onset   Cancer Mother    Early death Mother    Arthritis Father    Alcohol abuse Father    Heart disease Father    Hyperlipidemia Father    Hypertension Father    Crohn's disease Neg Hx    Colon polyps Neg Hx    Colon cancer Neg Hx    Esophageal cancer Neg Hx    Stomach cancer Neg Hx    Rectal cancer Neg Hx     Social History:  reports that he has never smoked. He has never used smokeless tobacco. He reports current alcohol use. He reports that he does not use drugs.  Allergies: No Known Allergies  Medications: I have reviewed the patient's current medications.  Results for orders placed or performed during the hospital encounter of 05/10/24 (from the past 48 hours)  Glucose, capillary     Status: None   Collection Time: 05/10/24 11:08 AM  Result Value Ref Range   Glucose-Capillary 94 70 - 99 mg/dL    Comment: Glucose reference range applies only to samples taken after fasting for at least 8 hours.    No  results found.  ROS 10 point review of systems is negative except as listed above in HPI.   Physical Exam Blood pressure 134/83, pulse 60, temperature 98.2 F (36.8 C), temperature source Oral, resp. rate 20, height 6' 3 (1.905 m), weight 127 kg, SpO2 99%. Constitutional: well-developed, well-nourished HEENT: pupils equal, round, reactive to light, 2mm b/l, moist conjunctiva, external inspection of ears and nose normal, hearing intact Oropharynx: normal oropharyngeal mucosa, normal dentition Neck: no thyromegaly, trachea midline, no midline cervical tenderness to palpation Chest: breath sounds equal bilaterally, normal respiratory effort, no midline or lateral chest wall tenderness to palpation/deformity Abdomen: soft, NT, no bruising, no hepatosplenomegaly Back: no wounds, no thoracic/lumbar spine tenderness to palpation, no thoracic/lumbar spine stepoffs, soft tissue mass right of midline, lower back ~3cm Extremities: 2+ radial and pedal pulses bilaterally, intact motor and sensation bilateral UE and LE, no peripheral edema Skin: warm, dry, no rashes Psych: normal memory, normal mood/affect     Assessment/Plan: Soft tissue mass of lower back - plan excision today. Informed consent was obtained after detailed explanation of risks, including bleeding, infection,  hematoma/seroma, temporary or permanent neuropathy, recurrence requiring explantation. All questions answered to the patient's satisfaction. FEN - strict NPO DVT - SCDs,   Dispo - home post-op    Dreama GEANNIE Hanger, MD General and Trauma Surgery [facility]

## 2024-05-10 NOTE — Discharge Instructions (Addendum)
 [facility]  LAPAROSCOPIC SURGERY: POST OP INSTRUCTIONS Always review your discharge instruction sheet given to you by the facility where your surgery was performed. IF YOU HAVE DISABILITY OR FAMILY LEAVE FORMS, YOU MUST BRING THEM TO THE OFFICE FOR PROCESSING.   DO NOT GIVE THEM TO YOUR DOCTOR.  PAIN CONTROL  Pain regimen: take over-the-counter tylenol  (acetaminophen ) 1000mg  every six hours, the prescription ibuprofen  (600mg ) every six hours and the robaxin  (methocarbamol ) 750mg  every six hours. With all three of these, you should be taking something every two hours. Example: tylenol  (acetaminophen ) at 8am, ibuprofen  at 10am, robaxin  (methocarbamol ) at 12pm, tylenol  (acetaminophen ) again at 2pm, ibuprofen  again at 4pm, robaxin  (methocarbamol ) at 6pm. You also have a prescription for oxycodone , which should be taken if the tylenol  (acetaminophen ), ibuprofen , and robaxin  (methocarbamol ) are not enough to control your pain. You may take the oxycodone  as frequently as every four hours as needed, but if you are taking the other medications as above, you should not need the oxycodone  this frequently. You have also been given a prescription for Miralax  which is a stool softener. Please take this as prescribed because the oxycodone  can cause constipation and the Miralax  will minimize or prevent constipation. Do not drive while taking or under the influence of the oxycodone  as it is a narcotic medication. Use ice packs to help control pain.  If you need a refill on your pain medication, please contact your pharmacy.  They will contact our office to request authorization. Prescriptions will not be filled after 5pm or on week-ends.  HOME MEDICATIONS Take your usually prescribed medications unless otherwise directed.  DIET You should follow a light diet the first few days after arrival home.  Be sure to include lots of fluids daily.  Do not consume alcohol while taking oxycodone  or  ibuprofen .   CONSTIPATION It is common to experience some constipation after surgery and if you are taking pain medication. Constipation will make your abdominal pain worse, so it is best to try to prevent it by increasing fluid intake and taking a stool softener. You have already been given a prescription for a mild laxative, Miralax , which you should be taking once daily. You can increase the Miralax  to twice daily or even three times daily until you have a bowel movement. If still no bowel movement 24 hours after taking Miralax  three times in one day, you may try magnesium citrate, available over the counter at a local pharmacy.   WOUND/INCISION CARE Most patients will experience some swelling and bruising in the area of the incisions.  Ice packs will help.  Swelling and bruising can take several days to resolve.  May shower beginning 05/11/2024.  Remove dressing prior to showering. May allow warm soapy water to run over incision, then rinse and pat dry.  Do not soak in any water (tubs, hot tubs, pools, lakes, oceans) for one week.   ACTIVITIES You may resume regular (light) daily activities beginning the next day--such as daily self-care, walking, climbing stairs--gradually increasing activities as tolerated. You may have sexual intercourse when it is comfortable.   You may drive when you are no longer taking narcotic pain medication, you can comfortably wear a seatbelt, and you can safely maneuver your car and apply brakes.  FOLLOW-UP You should see your doctor in the office for a follow-up appointment approximately 2-3 weeks after your surgery.  You should have been given your post-op/follow-up appointment when your surgery was scheduled.  If you did  not receive a post-op/follow-up appointment, make sure that you call for this appointment within a day or two after you arrive home to ensure a convenient appointment time.  WHEN TO CALL YOUR DOCTOR: Fever over 101.5 Inability to  urinate Continued bleeding from incision. Increased pain, redness, or drainage from the incision. Increasing abdominal pain  The clinic staff is available to answer your questions during regular business hours.  Please don't hesitate to call and ask to speak to one of the nurses for clinical concerns.  If you have a medical emergency, go to the nearest emergency room or call 911.  A surgeon from [facility] is always on call at the hospital. [address], [address], [place], [place]  [address] ? [address] [address], [place], [place]   [address] [phone] ? [phone] ? FAX [phone] Web site: [email or web address]

## 2024-05-10 NOTE — Op Note (Signed)
   Operative Note   Date: 05/10/2024  Procedure: excision of sotf tissue mass, back  Pre-op diagnosis: soft tissue mass, back Post-op diagnosis: soft tissuce mass, back; suspect sebaceous cyst  Indication and clinical history: The patient is a 58 y.o. year old male with soft tissue mass of lower back     Surgeon: Dreama GEANNIE Hanger, MD  Anesthesiologist: Leopoldo, MD Anesthesia: General  Findings:  Specimen: soft tissue mass, back EBL: <5cc Drains/Implants: none  Disposition: PACU - hemodynamically stable.  Description of procedure: The patient was positioned supine on the operating room table. General anesthetic induction and intubation were uneventful. The patient was repositioned to prone. Time-out was performed verifying correct patient, procedure, signature of informed consent, and administration of pre-operative antibiotics. The back was prepped and draped in the usual sterile fashion.  A transverse incision was made and deepened until the mass was encountered. The mass was consistent with a sebaceous cyst which did rupture during excision. The sac was circumferentially and entirely excised. The wound was irrigated and closed in layers.   Sterile dressings were applied. All sponge and instrument counts were correct at the conclusion of the procedure. The patient was awakened from anesthesia, extubated uneventfully, and transported to the PACU in good condition. There were no complications.     Dreama GEANNIE Hanger, MD General and Trauma Surgery [facility]

## 2024-05-10 NOTE — Transfer of Care (Signed)
 Immediate Anesthesia Transfer of Care Note  Patient: Wayne Larsen  Procedure(s) Performed: EXCISION MASS, BACK  Patient Location: PACU  Anesthesia Type:General  Level of Consciousness: awake, alert , and oriented  Airway & Oxygen Therapy: Patient Spontanous Breathing  Post-op Assessment: Report given to RN and Post -op Vital signs reviewed and stable  Post vital signs: Reviewed and stable  Last Vitals:  Vitals Value Taken Time  BP 164/116 05/10/24 13:20  Temp    Pulse 70 05/10/24 13:22  Resp 12 05/10/24 13:22  SpO2 96 % 05/10/24 13:22  Vitals shown include unfiled device data.  Last Pain:  Vitals:   05/10/24 1140  TempSrc:   PainSc: 0-No pain         Complications: No notable events documented.

## 2024-05-10 NOTE — Anesthesia Procedure Notes (Signed)
 Procedure Name: Intubation Date/Time: 05/10/2024 12:41 PM  Performed by: Mannie Krystal LABOR, CRNAPre-anesthesia Checklist: Patient identified, Emergency Drugs available, Suction available and Patient being monitored Patient Re-evaluated:Patient Re-evaluated prior to induction Oxygen Delivery Method: Circle system utilized Preoxygenation: Pre-oxygenation with 100% oxygen Induction Type: IV induction Ventilation: Mask ventilation without difficulty Laryngoscope Size: Miller and 3 Grade View: Grade I Tube type: Oral Tube size: 7.5 mm Number of attempts: 1 Airway Equipment and Method: Stylet and Oral airway Placement Confirmation: ETT inserted through vocal cords under direct vision, positive ETCO2 and breath sounds checked- equal and bilateral Secured at: 23 cm Tube secured with: Tape Dental Injury: Teeth and Oropharynx as per pre-operative assessment

## 2024-05-11 ENCOUNTER — Encounter (HOSPITAL_COMMUNITY): Payer: Self-pay | Admitting: Surgery

## 2024-05-11 LAB — SURGICAL PATHOLOGY

## 2024-05-11 NOTE — Anesthesia Postprocedure Evaluation (Signed)
 Anesthesia Post Note  Patient: Wayne Larsen  Procedure(s) Performed: EXCISION MASS, BACK     Patient location during evaluation: PACU Anesthesia Type: General Level of consciousness: awake and alert Pain management: pain level controlled Vital Signs Assessment: post-procedure vital signs reviewed and stable Respiratory status: spontaneous breathing, nonlabored ventilation and respiratory function stable Cardiovascular status: blood pressure returned to baseline and stable Postop Assessment: no apparent nausea or vomiting Anesthetic complications: no   No notable events documented.                  Myda Detwiler

## 2024-05-17 ENCOUNTER — Ambulatory Visit (INDEPENDENT_AMBULATORY_CARE_PROVIDER_SITE_OTHER): Admitting: Family Medicine

## 2024-05-17 ENCOUNTER — Encounter: Payer: Self-pay | Admitting: Family Medicine

## 2024-05-17 VITALS — BP 118/70 | HR 67 | Temp 98.0°F | Resp 16 | Ht 75.0 in | Wt 285.0 lb

## 2024-05-17 DIAGNOSIS — I10 Essential (primary) hypertension: Secondary | ICD-10-CM

## 2024-05-17 DIAGNOSIS — Z7984 Long term (current) use of oral hypoglycemic drugs: Secondary | ICD-10-CM

## 2024-05-17 DIAGNOSIS — E1169 Type 2 diabetes mellitus with other specified complication: Secondary | ICD-10-CM

## 2024-05-17 DIAGNOSIS — L03818 Cellulitis of other sites: Secondary | ICD-10-CM | POA: Diagnosis not present

## 2024-05-17 DIAGNOSIS — R61 Generalized hyperhidrosis: Secondary | ICD-10-CM | POA: Diagnosis not present

## 2024-05-17 DIAGNOSIS — Z7985 Long-term (current) use of injectable non-insulin antidiabetic drugs: Secondary | ICD-10-CM

## 2024-05-17 DIAGNOSIS — E785 Hyperlipidemia, unspecified: Secondary | ICD-10-CM | POA: Diagnosis not present

## 2024-05-17 DIAGNOSIS — Z125 Encounter for screening for malignant neoplasm of prostate: Secondary | ICD-10-CM | POA: Diagnosis not present

## 2024-05-17 DIAGNOSIS — Z0000 Encounter for general adult medical examination without abnormal findings: Secondary | ICD-10-CM | POA: Diagnosis not present

## 2024-05-17 LAB — CBC
HCT: 48.9 % (ref 39.0–52.0)
Hemoglobin: 16.2 g/dL (ref 13.0–17.0)
MCHC: 33.2 g/dL (ref 30.0–36.0)
MCV: 87.5 fl (ref 78.0–100.0)
Platelets: 260 K/uL (ref 150.0–400.0)
RBC: 5.59 Mil/uL (ref 4.22–5.81)
RDW: 14.4 % (ref 11.5–15.5)
WBC: 6.8 K/uL (ref 4.0–10.5)

## 2024-05-17 LAB — C-REACTIVE PROTEIN: CRP: <1 mg/dL (ref 0.5–20.0)

## 2024-05-17 LAB — LIPID PANEL
Cholesterol: 146 mg/dL (ref 0–200)
HDL: 45.2 mg/dL (ref >39.00)
LDL Cholesterol: 72 mg/dL (ref 0–99)
NonHDL: 100.32
Total CHOL/HDL Ratio: 3
Triglycerides: 142 mg/dL (ref 0.0–149.0)
VLDL: 28.4 mg/dL (ref 0.0–40.0)

## 2024-05-17 LAB — PSA: PSA: 0.51 ng/mL (ref 0.10–4.00)

## 2024-05-17 LAB — GLUCOSE, POCT (MANUAL RESULT ENTRY): POC Glucose: 146 mg/dL — AB (ref 70–99)

## 2024-05-17 MED ORDER — DOXYCYCLINE HYCLATE 100 MG PO TABS: 100.0000 mg | ORAL_TABLET | Freq: Two times a day (BID) | ORAL | 0 refills | Status: AC

## 2024-05-17 NOTE — Patient Instructions (Addendum)
 A few things to remember from today's visit:  Routine general medical examination at a health care facility  Benign essential hypertension - Plan: CBC  Type 2 diabetes mellitus with other specified complication, without long-term current use of insulin  (HCC)  Hyperlipidemia associated with type 2 diabetes mellitus (HCC) - Plan: Lipid Panel  Prostate cancer screening - Plan: PSA  Diaphoresis - Plan: C-reactive protein, POC Glucose (CBG)  Cellulitis of other specified site - Plan: CBC, C-reactive protein, doxycycline  (VIBRA -TABS) 100 MG tablet  Monitor for new symptoms. Keep wound clean with soap and water and dry. Keep appt with surgeon.  If you need refills for medications you take chronically, please call your pharmacy. Do not use My Chart to request refills or for acute issues that need immediate attention. If you send a my chart message, it may take a few days to be addressed, specially if I am not in the office.  Please be sure medication list is accurate. If a new problem present, please set up appointment sooner than planned today.  Health Maintenance, Male Adopting a healthy lifestyle and getting preventive care are important in promoting health and wellness. Ask your health care provider about: The right schedule for you to have regular tests and exams. Things you can do on your own to prevent diseases and keep yourself healthy. What should I know about diet, weight, and exercise? Eat a healthy diet  Eat a diet that includes plenty of vegetables, fruits, low-fat dairy products, and lean protein. Do not eat a lot of foods that are high in solid fats, added sugars, or sodium. Maintain a healthy weight Body mass index (BMI) is a measurement that can be used to identify possible weight problems. It estimates body fat based on height and weight. Your health care provider can help determine your BMI and help you achieve or maintain a healthy weight. Get regular exercise Get  regular exercise. This is one of the most important things you can do for your health. Most adults should: Exercise for at least 150 minutes each week. The exercise should increase your heart rate and make you sweat (moderate-intensity exercise). Do strengthening exercises at least twice a week. This is in addition to the moderate-intensity exercise. Spend less time sitting. Even light physical activity can be beneficial. Watch cholesterol and blood lipids Have your blood tested for lipids and cholesterol at 60 years of age, then have this test every 5 years. You may need to have your cholesterol levels checked more often if: Your lipid or cholesterol levels are high. You are older than 60 years of age. You are at high risk for heart disease. What should I know about cancer screening? Many types of cancers can be detected early and may often be prevented. Depending on your health history and family history, you may need to have cancer screening at various ages. This may include screening for: Colorectal cancer. Prostate cancer. Skin cancer. Lung cancer. What should I know about heart disease, diabetes, and high blood pressure? Blood pressure and heart disease High blood pressure causes heart disease and increases the risk of stroke. This is more likely to develop in people who have high blood pressure readings or are overweight. Talk with your health care provider about your target blood pressure readings. Have your blood pressure checked: Every 3-5 years if you are 36-86 years of age. Every year if you are 66 years old or older. If you are between the ages of 17 and 43 and are  a current or former smoker, ask your health care provider if you should have a one-time screening for abdominal aortic aneurysm (AAA). Diabetes Have regular diabetes screenings. This checks your fasting blood sugar level. Have the screening done: Once every three years after age 73 if you are at a normal weight and  have a low risk for diabetes. More often and at a younger age if you are overweight or have a high risk for diabetes. What should I know about preventing infection? Hepatitis B If you have a higher risk for hepatitis B, you should be screened for this virus. Talk with your health care provider to find out if you are at risk for hepatitis B infection. Hepatitis C Blood testing is recommended for: Everyone born from [birth year] through 1967. Anyone with known risk factors for hepatitis C. Sexually transmitted infections (STIs) You should be screened each year for STIs, including gonorrhea and chlamydia, if: You are sexually active and are younger than 60 years of age. You are older than 60 years of age and your health care provider tells you that you are at risk for this type of infection. Your sexual activity has changed since you were last screened, and you are at increased risk for chlamydia or gonorrhea. Ask your health care provider if you are at risk. Ask your health care provider about whether you are at high risk for HIV. Your health care provider may recommend a prescription medicine to help prevent HIV infection. If you choose to take medicine to prevent HIV, you should first get tested for HIV. You should then be tested every 3 months for as long as you are taking the medicine. Follow these instructions at home: Alcohol use Do not drink alcohol if your health care provider tells you not to drink. If you drink alcohol: Limit how much you have to 0-2 drinks a day. Know how much alcohol is in your drink. In the [place] one drink equals one 12 oz bottle of beer (355 mL), one 5 oz glass of wine (148 mL), or one 1 oz glass of hard liquor (44 mL). Lifestyle Do not use any products that contain nicotine or tobacco. These products include cigarettes, chewing tobacco, and vaping devices, such as e-cigarettes. If you need help quitting, ask your health care provider. Do not use street drugs. Do not  share needles. Ask your health care provider for help if you need support or information about quitting drugs. General instructions Schedule regular health, dental, and eye exams. Stay current with your vaccines. Tell your health care provider if: You often feel depressed. You have ever been abused or do not feel safe at home. Summary Adopting a healthy lifestyle and getting preventive care are important in promoting health and wellness. Follow your health care provider's instructions about healthy diet, exercising, and getting tested or screened for diseases. Follow your health care provider's instructions on monitoring your cholesterol and blood pressure. This information is not intended to replace advice given to you by your health care provider. Make sure you discuss any questions you have with your health care provider. Document Revised: 01/29/2021 Document Reviewed: 01/29/2021 Elsevier Patient Education  2024 [employer].

## 2024-05-17 NOTE — Progress Notes (Signed)
 HPI:  [name] is a 58 y.o.male with a PMHx significant for DM II, HLD, OSA, CAD, RBBB, and peripheral neuropathy here today for his routine physical examination.  Last CPE: 05/16/23  Since his last visit, he had surgery for excision of a mass on his back on 05/10/24. Per pathology results, the mass was benign.   Exercise: Regular exercise prior to his recent exercise. 3 miles per night jogging, Doing light weights.  Diet: Overall healthy.  Sleep: 5 hours/night. Compliant with BiPAP. Follows up with sleep medicine annually Pt is able to track his sleep and Bipip compliance on the app. Has been taking Ambien  as a sleep aid  Smoking: Never Alcohol consumption: None  Dental: UTD with routine dental care.  Vision: UTD with routine vision exams  Immunization History  Administered Date(s) Administered   Influenza,inj,Quad PF,6+ Mos 08/25/2019, 09/07/2020   Moderna Sars-Covid-2 Vaccination 11/30/2019, 12/31/2019   PFIZER(Purple Top)SARS-COV-2 Vaccination 11/24/2019, 12/22/2019    Health Maintenance  Topic Date Due   Diabetic kidney evaluation - Urine ACR  Never done   DTaP/Tdap/Td (1 - Tdap) Never done   Pneumococcal Vaccine: 50+ Years (1 of 2 - PCV) Never done   OPHTHALMOLOGY EXAM  07/31/2022   FOOT EXAM  12/24/2023   INFLUENZA VACCINE  05/28/2024 (Originally 04/23/2024)   COVID-19 Vaccine (5 - 2024-25 season) 06/02/2024 (Originally 05/25/2023)   HIV Screening  08/24/2024 (Originally 12/27/1980)   HEMOGLOBIN A1C  11/04/2024   Diabetic kidney evaluation - eGFR measurement  05/04/2025   Colonoscopy  09/26/2025   Hepatitis C Screening  Completed   Hepatitis B Vaccines 19-59 Average Risk  Aged Out   HPV VACCINES  Aged Out   Meningococcal B Vaccine  Aged Out   Zoster Vaccines- Shingrix  Discontinued   Last prostate ca screening: Lab Results  Component Value Date   PSA 0.36 05/16/2023   PSA 0.35 05/14/2022    Chronic medical problems:  Hypertension Medications:  Amlodipine  5 mg once daily, HCTZ 25 mg once daily, and Lisinopril  20 mg once daily.  Side effects: None.  Negative for unusual or severe headache, visual changes, exertional chest pain, dyspnea,  focal weakness, or edema. Lab Results  Component Value Date   CREATININE 1.23 05/04/2024   BUN 17 05/04/2024   NA 137 05/04/2024   K 4.1 05/04/2024   CL 99 05/04/2024   CO2 29 05/04/2024    Hyperlipidemia: Currently on Atorvastatin  40 mg once daily. Side effects from medication: none.  Following a low fat diet overall. Lab Results  Component Value Date   CHOL 133 05/16/2023   HDL 39.80 05/16/2023   LDLCALC 66 05/16/2023   LDLDIRECT 64.0 05/14/2022   TRIG 136.0 05/16/2023   CHOLHDL 3 05/16/2023    Diabetes Mellitus II:  Currently on Farxiga 10 mg once daily.  Good compliance and tolerance.  Has been working on [employer] and exercise.  Negative for symptoms of hypoglycemia, polyuria, polydipsia, numbness extremities, foot ulcers/trauma Lab Results  Component Value Date   HGBA1C 5.9 (H) 05/04/2024   Lab Results  Component Value Date   MICROALBUR 0.4 02/27/2021    Negative for high alcohol intake or tobacco use.   Acute concerns addressed today: Diaphoresis Pt complains of episodes of diaphoresis and hot flashes.  He has also been experiencing right lower back pain.  This is new for him, starting after his surgery last week. Has not been monitoring his temperature at home.  He states he was taking Dyna Hex  4 prior to his surgery.  He notes he went under general anesthesia.  Denies any chest pain, nausea, palpitations, urinary frequency, or burning sensation while urinating.    Review of Systems  Current Outpatient Medications on File Prior to Visit  Medication Sig Dispense Refill   acetaminophen  (TYLENOL ) 500 MG tablet Take 2 tablets (1,000 mg total) by mouth 4 (four) times daily. 120 tablet 3   amLODipine  (NORVASC ) 5 MG tablet Take 1 tablet (5 mg total) by mouth  daily. 90 tablet 3   atorvastatin  (LIPITOR) 40 MG tablet TAKE 1 TABLET BY MOUTH EVERYDAY AT BEDTIME 90 tablet 3   FARXIGA 10 MG TABS tablet Take 10 mg by mouth daily.     hydrochlorothiazide  (HYDRODIURIL ) 25 MG tablet Take 1 tablet (25 mg total) by mouth daily. 90 tablet 3   ibuprofen  (ADVIL ) 600 MG tablet Take 1 tablet (600 mg total) by mouth every 6 (six) hours as needed. 120 tablet 1   lisinopril  (ZESTRIL ) 20 MG tablet Take 1 tablet (20 mg total) by mouth daily. 90 tablet 3   methocarbamol  (ROBAXIN ) 750 MG tablet Take 1 tablet (750 mg total) by mouth 4 (four) times daily. 120 tablet 1   MOUNJARO 10 MG/0.5ML Pen Inject 10 mg into the skin every Friday.     NON FORMULARY Pt uses a cpap nightly     oxyCODONE  (ROXICODONE ) 5 MG immediate release tablet Take 1 tablet (5 mg total) by mouth every 4 (four) hours as needed. 15 tablet 0   polyethylene glycol (MIRALAX ) 17 g packet Take 17 g by mouth daily. 30 each 1   potassium chloride  (KLOR-CON ) 10 MEQ tablet Take 1 tablet (10 mEq total) by mouth daily. 90 tablet 3   pregabalin (LYRICA) 200 MG capsule Take 200 mg by mouth daily.     zolpidem  (AMBIEN ) 10 MG tablet Take 1 tablet (10 mg total) by mouth at bedtime as needed for sleep. 30 tablet 2   No current facility-administered medications on file prior to visit.    Past Medical History:  Diagnosis Date   Arthritis    Blood in stool    history of blood in stool   Cancer (HCC) 2008   kidney-R   Chronic kidney disease    Diabetes mellitus without complication (HCC)    on meds   GERD (gastroesophageal reflux disease)    Heart murmur    History of colon polyps    Hypertension    Myocardial infarction (HCC)    hx of   Seasonal allergies    Sleep apnea    uses CPAP    Past Surgical History:  Procedure Laterality Date   [place]   [facility]   COLONOSCOPY  09/26/2022   EXCISION MASS, BACK N/A 05/10/2024   Procedure: EXCISION MASS, BACK;  Surgeon: Paola Dreama SAILOR, MD;   Location: [facility] OR;  Service: General;  Laterality: N/A;   LAMINECTOMY  1989   L5-S1   LAMINECTOMY  1990   L4-L5   NEPHRECTOMY Right 2008   NOSE SURGERY  2015   ORIF ANKLE FRACTURE Left    WRIST SURGERY Right     No Known Allergies  Family History  Problem Relation Age of Onset   Cancer Mother    Early death Mother    Arthritis Father    Alcohol abuse Father    Heart disease Father    Hyperlipidemia Father    Hypertension Father    Crohn's disease Neg Hx  Colon polyps Neg Hx    Colon cancer Neg Hx    Esophageal cancer Neg Hx    Stomach cancer Neg Hx    Rectal cancer Neg Hx     Social History   Socioeconomic History   Marital status: Married    Spouse name: Not on file   Number of children: 2   Years of education: Not on file   Highest education level: Bachelor's degree (e.g., BA, AB, BS)  Occupational History   Not on file  Tobacco Use   Smoking status: Never   Smokeless tobacco: Never  Vaping Use   Vaping status: Never Used  Substance and Sexual Activity   Alcohol use: Yes    Alcohol/week: 0.0 - 2.0 standard drinks of alcohol    Comment: maybe one drink a month   Drug use: Never   Sexual activity: Yes  Other Topics Concern   Not on file  Social History Narrative   Not on file   Social Drivers of Health   Financial Resource Strain: Medium Risk (12/31/2023)   Overall Financial Resource Strain (CARDIA)    Difficulty of Paying Living Expenses: Somewhat hard  Food Insecurity: Patient Declined (12/31/2023)   Hunger Vital Sign    Worried About Running Out of Food in the Last Year: Patient declined    Ran Out of Food in the Last Year: Patient declined  Transportation Needs: Unknown (12/31/2023)   PRAPARE - Transportation    Lack of Transportation (Medical): No    Lack of Transportation (Non-Medical): Patient declined  Physical Activity: Sufficiently Active (12/31/2023)   Exercise Vital Sign    Days of Exercise per Week: 5 days    Minutes of Exercise per  Session: 40 min  Stress: Stress Concern Present (12/31/2023)   [employer] of Occupational Health - Occupational Stress Questionnaire    Feeling of Stress : To some extent  Social Connections: Unknown (12/31/2023)   Social Connection and Isolation Panel    Frequency of Communication with Friends and Family: Twice a week    Frequency of Social Gatherings with Friends and Family: Patient declined    Attends Religious Services: Patient declined    Database administrator or Organizations: No    Attends Engineer, structural: Not on file    Marital Status: Separated    There were no vitals filed for this visit. There is no height or weight on file to calculate BMI.  Wt Readings from Last 3 Encounters:  05/10/24 280 lb (127 kg)  05/04/24 284 lb 6.4 oz (129 kg)  01/01/24 293 lb (132.9 kg)    Physical Exam Vitals and nursing note reviewed.  Constitutional:      General: He is not in acute distress.    Appearance: He is well-developed.  HENT:     Head: Normocephalic and atraumatic.     Right Ear: Tympanic membrane, ear canal and external ear normal.     Left Ear: Tympanic membrane, ear canal and external ear normal.     Ears:     Comments: Scaly ear canal, right. Eyes:     Extraocular Movements: Extraocular movements intact.     Conjunctiva/sclera: Conjunctivae normal.     Pupils: Pupils are equal, round, and reactive to light.  Neck:     Thyroid: No thyromegaly.     Trachea: No tracheal deviation.  Cardiovascular:     Rate and Rhythm: Normal rate and regular rhythm.     Pulses:  Dorsalis pedis pulses are 2+ on the right side and 2+ on the left side.     Heart sounds: No murmur heard. Pulmonary:     Effort: Pulmonary effort is normal. No respiratory distress.     Breath sounds: Normal breath sounds.  Abdominal:     Palpations: Abdomen is soft. There is no hepatomegaly or mass.     Tenderness: There is no abdominal tenderness.  Genitourinary:    Comments:  No concerns. Musculoskeletal:        General: No tenderness.     Cervical back: Normal range of motion.     Comments: No major deformities appreciated and no signs of synovitis.  Lymphadenopathy:     Cervical: No cervical adenopathy.     Upper Body:     Right upper body: No supraclavicular adenopathy.     Left upper body: No supraclavicular adenopathy.  Skin:    General: Skin is warm.     Findings: No erythema.      Neurological:     General: No focal deficit present.     Mental Status: He is alert and oriented to person, place, and time.     Cranial Nerves: No cranial nerve deficit.     Sensory: No sensory deficit.     Gait: Gait normal.     Deep Tendon Reflexes:     Reflex Scores:      Bicep reflexes are 2+ on the right side and 2+ on the left side.      Patellar reflexes are 2+ on the right side and 2+ on the left side. Psychiatric:        Mood and Affect: Mood and affect normal.     ASSESSMENT AND PLAN: [name] was seen today for his routine physical examination.  No orders of the defined types were placed in this encounter.   Routine general medical examination at a health care facility  Benign essential hypertension  Type 2 diabetes mellitus with other specified complication, without long-term current use of insulin  (HCC)  Hyperlipidemia associated with type 2 diabetes mellitus (HCC)  Prostate cancer screening   No follow-ups on file.  I, Vernell Forest, acting as a scribe for Boomer Winders [place], MD., have documented all relevant documentation on the behalf of Wayne Larsen [place], MD, as directed by   while in the presence of Wayne Cost [place], MD.  I, Winfield Caba [place], MD, have reviewed all documentation for this visit. The documentation on 05/17/24 for the exam, diagnosis, procedures, and orders are all accurate and complete.   Wayne Moncada G. [place], MD  [facility]

## 2024-05-19 ENCOUNTER — Telehealth: Payer: Self-pay

## 2024-05-19 NOTE — Telephone Encounter (Signed)
 Left pt a voicemail letting him know that his form is ready for pick up; up at the front. Copy sent to scan.

## 2024-05-20 ENCOUNTER — Ambulatory Visit: Payer: Self-pay | Admitting: Family Medicine

## 2024-05-20 NOTE — Assessment & Plan Note (Signed)
 Well controlled. On Farxiga 10 mg daily and Mounjaro 10 mg weekly. Follows with endocrinologist.

## 2024-05-20 NOTE — Assessment & Plan Note (Signed)
 Currently on Atorvastatin  40 mg daily and low fat diet. Further recommendations according to FLP results.

## 2024-05-20 NOTE — Assessment & Plan Note (Signed)
 BP adequately controlled. Continue lisinopril  20 mg daily, HCTZ 25 mg daily, and amlodipine  5 mg daily as well as low salt diet.SABRA

## 2024-05-20 NOTE — Assessment & Plan Note (Signed)
 We discussed the importance of regular physical activity and healthy diet for prevention of chronic illness and/or complications. Preventive guidelines reviewed. Vaccination: He needs Prevnar 20 but because recent surgery, I recommend waiting until he fully recovers. Next CPE in a year.

## 2024-08-24 ENCOUNTER — Emergency Department (HOSPITAL_COMMUNITY)

## 2024-08-24 ENCOUNTER — Emergency Department (HOSPITAL_COMMUNITY)
Admission: EM | Admit: 2024-08-24 | Discharge: 2024-08-25 | Disposition: A | Attending: Emergency Medicine | Admitting: Emergency Medicine

## 2024-08-24 ENCOUNTER — Other Ambulatory Visit: Payer: Self-pay

## 2024-08-24 ENCOUNTER — Encounter (HOSPITAL_COMMUNITY): Payer: Self-pay

## 2024-08-24 DIAGNOSIS — S6991XA Unspecified injury of right wrist, hand and finger(s), initial encounter: Secondary | ICD-10-CM | POA: Diagnosis present

## 2024-08-24 DIAGNOSIS — I10 Essential (primary) hypertension: Secondary | ICD-10-CM | POA: Insufficient documentation

## 2024-08-24 DIAGNOSIS — I2510 Atherosclerotic heart disease of native coronary artery without angina pectoris: Secondary | ICD-10-CM | POA: Diagnosis not present

## 2024-08-24 DIAGNOSIS — E119 Type 2 diabetes mellitus without complications: Secondary | ICD-10-CM | POA: Diagnosis not present

## 2024-08-24 DIAGNOSIS — W268XXA Contact with other sharp object(s), not elsewhere classified, initial encounter: Secondary | ICD-10-CM | POA: Diagnosis not present

## 2024-08-24 DIAGNOSIS — Z79899 Other long term (current) drug therapy: Secondary | ICD-10-CM | POA: Diagnosis not present

## 2024-08-24 DIAGNOSIS — Z23 Encounter for immunization: Secondary | ICD-10-CM | POA: Insufficient documentation

## 2024-08-24 DIAGNOSIS — S61411A Laceration without foreign body of right hand, initial encounter: Secondary | ICD-10-CM | POA: Insufficient documentation

## 2024-08-24 MED ORDER — TETANUS-DIPHTH-ACELL PERTUSSIS 5-2-15.5 LF-MCG/0.5 IM SUSP
0.5000 mL | Freq: Once | INTRAMUSCULAR | Status: AC
  Administered 2024-08-24: 0.5 mL via INTRAMUSCULAR
  Filled 2024-08-24: qty 0.5

## 2024-08-24 NOTE — ED Triage Notes (Signed)
 Pt cut his right hand about 1.5 hours ago with metal, bleeding controlled with gauze. Pt denies taking any blood thinners.

## 2024-08-25 MED ORDER — CEPHALEXIN 500 MG PO CAPS: 500.0000 mg | ORAL_CAPSULE | Freq: Four times a day (QID) | ORAL | 0 refills | Status: AC

## 2024-08-25 MED ORDER — LIDOCAINE HCL (PF) 1 % IJ SOLN
5.0000 mL | Freq: Once | INTRAMUSCULAR | Status: AC
  Administered 2024-08-25: 5 mL via INTRADERMAL
  Filled 2024-08-25: qty 5

## 2024-08-25 NOTE — ED Provider Notes (Signed)
 [place] EMERGENCY DEPARTMENT AT [facility] Provider Note   CSN: [id number] Arrival date & time: 08/24/24  2127     Patient presents with: Extremity Laceration   Wayne Larsen is a 58 y.o. male.   The history is provided by the patient and medical records.   58 year old male with history of hypertension, coronary artery disease, hyperlipidemia, diabetes, presenting to the ED with right hand laceration.  Patient states he was getting ready for work and storm door had blown open.  Went to close it and cast iron handle shattered in his hand causing a laceration.  He states he could not  get this to stop bleeding at home.  Tetanus was updated in triage.  He is right hand dominant.  Prior to Admission medications   Medication Sig Start Date End Date Taking? Authorizing Provider  cephALEXin  (KEFLEX ) 500 MG capsule Take 1 capsule (500 mg total) by mouth 4 (four) times daily. 08/25/24  Yes Jarold Olam HERO, PA-C  acetaminophen  (TYLENOL ) 500 MG tablet Take 2 tablets (1,000 mg total) by mouth 4 (four) times daily. 05/10/24 05/10/25  Paola Dreama SAILOR, MD  amLODipine  (NORVASC ) 5 MG tablet Take 1 tablet (5 mg total) by mouth daily. 12/05/23   Nahser, Aleene PARAS, MD  atorvastatin  (LIPITOR) 40 MG tablet TAKE 1 TABLET BY MOUTH EVERYDAY AT BEDTIME 12/05/23   Nahser, Aleene PARAS, MD  FARXIGA 10 MG TABS tablet Take 10 mg by mouth daily. 03/01/19   [provider]  hydrochlorothiazide  (HYDRODIURIL ) 25 MG tablet Take 1 tablet (25 mg total) by mouth daily. 12/05/23   Nahser, Aleene PARAS, MD  ibuprofen  (ADVIL ) 600 MG tablet Take 1 tablet (600 mg total) by mouth every 6 (six) hours as needed. 05/10/24   Paola Dreama SAILOR, MD  lisinopril  (ZESTRIL ) 20 MG tablet Take 1 tablet (20 mg total) by mouth daily. 12/05/23   Nahser, Aleene PARAS, MD  methocarbamol  (ROBAXIN ) 750 MG tablet Take 1 tablet (750 mg total) by mouth 4 (four) times daily. 05/10/24   Paola Dreama SAILOR, MD  MOUNJARO 10 MG/0.5ML Pen Inject 10 mg  into the skin every Friday. 10/03/23   [provider]  NON FORMULARY Pt uses a cpap nightly    [provider]  oxyCODONE  (ROXICODONE ) 5 MG immediate release tablet Take 1 tablet (5 mg total) by mouth every 4 (four) hours as needed. 05/10/24   Paola Dreama SAILOR, MD  polyethylene glycol (MIRALAX ) 17 g packet Take 17 g by mouth daily. 05/10/24   Paola Dreama SAILOR, MD  potassium chloride  (KLOR-CON ) 10 MEQ tablet Take 1 tablet (10 mEq total) by mouth daily. 12/05/23   Nahser, Aleene PARAS, MD  pregabalin (LYRICA) 200 MG capsule Take 200 mg by mouth daily. 02/08/19   [provider]  zolpidem  (AMBIEN ) 10 MG tablet Take 1 tablet (10 mg total) by mouth at bedtime as needed for sleep. 06/28/20   Jude Harden GAILS, MD    Allergies: Patient has no known allergies.    Review of Systems  Skin:  Positive for wound.  All other systems reviewed and are negative.   Updated Vital Signs BP 116/81   Pulse 74   Temp 97.7 F (36.5 C)   Resp 18   Ht 6' 3 (1.905 m)   Wt 122.9 kg   SpO2 96%   BMI 33.87 kg/m   Physical Exam Vitals and nursing note reviewed.  Constitutional:      Appearance: He is well-developed.  HENT:  Head: Normocephalic and atraumatic.  Eyes:     Conjunctiva/sclera: Conjunctivae normal.     Pupils: Pupils are equal, round, and reactive to light.  Cardiovascular:     Rate and Rhythm: Normal rate and regular rhythm.     Heart sounds: Normal heart sounds.  Pulmonary:     Effort: Pulmonary effort is normal.     Breath sounds: Normal breath sounds.  Musculoskeletal:        General: Normal range of motion.     Cervical back: Normal range of motion.     Comments: L shaped laceration along right palm just distal to the 1st MCP joint approx 4cm in length, there is no apparent tendon injury, normal ROM of all fingers without issue, wound is hemostatic  Skin:    General: Skin is warm and dry.  Neurological:     Mental Status: He is alert and oriented to person,  place, and time.     (all labs ordered are listed, but only abnormal results are displayed) Labs Reviewed - No data to display  EKG: None  Radiology: DG Hand Complete Right Result Date: 08/24/2024 EXAM: 3 OR MORE VIEW(S) XRAY OF THE RIGHT HAND 08/24/2024 10:42:00 PM COMPARISON: None available. CLINICAL HISTORY: right hand injury FINDINGS: BONES AND JOINTS: No acute fracture. No focal osseous lesion. No joint dislocation. SOFT TISSUES: Soft tissue swelling and irregularity along the radial aspect second MCP joint. Multiple punctate radiopaque densities within the superficial soft tissues subjacent to the second MCP joint. IMPRESSION: 1. Soft tissue swelling and irregularity along the radial aspect of the second MCP joint, with multiple punctate radiopaque densities within the superficial soft tissues subjacent to the joint. Electronically signed by: Dorethia Molt MD 08/24/2024 10:55 PM EST RP Workstation: [id number]     Procedures   LACERATION REPAIR Performed by: Olam CHRISTELLA Slocumb Authorized by: Olam CHRISTELLA Slocumb Consent: Verbal consent obtained. Risks and benefits: risks, benefits and alternatives were discussed Consent given by: patient Patient identity confirmed: provided demographic data Prepped and Draped in normal sterile fashion Wound explored  Laceration Location: right palm  Laceration Length: 4cm  No Foreign Bodies seen or palpated  Anesthesia: local infiltration  Local anesthetic: lidocaine  1% without epinephrine  Anesthetic total: 5 ml  Irrigation method: syringe Amount of cleaning: standard  Skin closure: 4-0 prolene  Number of sutures: 4  Technique: simple interrupted  Patient tolerance: Patient tolerated the procedure well with no immediate complications.   Medications Ordered in the ED  Tdap (ADACEL ) injection 0.5 mL (0.5 mLs Intramuscular Given 08/24/24 2230)  lidocaine  (PF) (XYLOCAINE ) 1 % injection 5 mL (5 mLs Intradermal Given by Other 08/25/24 [id number])                                     Medical Decision Making Amount and/or Complexity of Data Reviewed Radiology: ordered and independent interpretation performed.  Risk Prescription drug management.   58 year old male presenting to the ED with laceration of right palm after cast iron piece of storm door shattered in his hand.  Sustained 4 cm L-shaped laceration to right palm.  This is overall superficial without any deep tissue, vessel, or tendon involvement.  Maintains normal range of motion of all fingers.  X-ray without acute bony findings, does have some foreign bodies noted on x-ray.  Wound was copiously irrigated and few small shards of metal removed.  Wound explored afterwards, no retained foreign body  seen.  Laceration repaired as above, tolerated well.  Plan to discharge home with wound care instructions and PCP follow-up for suture removal in about 1 week.  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  Will place on course of Keflex prophylactically.  Can return here for new concerns.  Final diagnoses:  Laceration of right hand without foreign body, initial encounter    ED Discharge Orders          Ordered    cephALEXin (KEFLEX) 500 MG capsule  4 times daily        08/25/24 0359               Jarold Olam HERO, PA-C 08/25/24 0404    Jerral Meth, MD 08/25/24 [phone]

## 2024-08-25 NOTE — Discharge Instructions (Signed)
 Take the prescribed medication as directed. Keep sutures clean and dry. Follow-up with primary care doctor in about 1 week (7-10 days is fine). Return to the ED for new or worsening symptoms.

## 2024-10-05 ENCOUNTER — Encounter: Payer: Self-pay | Admitting: Cardiovascular Disease
